# Patient Record
Sex: Female | Born: 1945
Health system: Southern US, Community
[De-identification: ages and names within clinical notes are randomized; demographics above are authoritative.]

## PROBLEM LIST (undated history)

## (undated) DIAGNOSIS — M1990 Unspecified osteoarthritis, unspecified site: Secondary | ICD-10-CM

## (undated) DIAGNOSIS — H269 Unspecified cataract: Secondary | ICD-10-CM

## (undated) DIAGNOSIS — I10 Essential (primary) hypertension: Secondary | ICD-10-CM

## (undated) DIAGNOSIS — E7800 Pure hypercholesterolemia, unspecified: Secondary | ICD-10-CM

## (undated) HISTORY — PX: OOPHORECTOMY: SHX86

---

## 2004-09-06 ENCOUNTER — Ambulatory Visit (HOSPITAL_COMMUNITY): Admission: RE | Admit: 2004-09-06 | Discharge: 2004-09-06 | Payer: Self-pay | Admitting: Family Medicine

## 2005-04-26 ENCOUNTER — Emergency Department (HOSPITAL_COMMUNITY): Admission: EM | Admit: 2005-04-26 | Discharge: 2005-04-26 | Payer: Self-pay | Admitting: Emergency Medicine

## 2005-06-06 ENCOUNTER — Emergency Department: Payer: Self-pay | Admitting: Emergency Medicine

## 2008-02-10 ENCOUNTER — Encounter (INDEPENDENT_AMBULATORY_CARE_PROVIDER_SITE_OTHER): Payer: Self-pay | Admitting: General Surgery

## 2008-02-10 ENCOUNTER — Ambulatory Visit (HOSPITAL_COMMUNITY): Admission: RE | Admit: 2008-02-10 | Discharge: 2008-02-10 | Payer: Self-pay | Admitting: General Surgery

## 2008-08-29 ENCOUNTER — Ambulatory Visit: Payer: Self-pay | Admitting: Family Medicine

## 2008-09-20 ENCOUNTER — Ambulatory Visit: Payer: Self-pay | Admitting: Family Medicine

## 2009-11-25 ENCOUNTER — Ambulatory Visit (HOSPITAL_COMMUNITY): Admission: RE | Admit: 2009-11-25 | Discharge: 2009-11-25 | Payer: Self-pay | Admitting: Family Medicine

## 2009-11-25 ENCOUNTER — Encounter: Payer: Self-pay | Admitting: Orthopedic Surgery

## 2010-05-20 ENCOUNTER — Telehealth (INDEPENDENT_AMBULATORY_CARE_PROVIDER_SITE_OTHER): Payer: Self-pay

## 2010-05-20 ENCOUNTER — Encounter: Payer: Self-pay | Admitting: Gastroenterology

## 2010-05-21 ENCOUNTER — Ambulatory Visit (HOSPITAL_COMMUNITY): Admission: RE | Admit: 2010-05-21 | Payer: Self-pay | Admitting: Gastroenterology

## 2010-07-22 NOTE — Letter (Signed)
Summary: TCS ORDER/TRIAGE  TCS ORDER/TRIAGE   Imported By: Rexene Alberts 05/20/2010 11:12:46  _____________________________________________________________________  External Attachment:    Type:   Image     Comment:   External Document

## 2010-07-22 NOTE — Progress Notes (Signed)
Summary: pt cx TCS for 05/21/2010/ Family emergency  Phone Note Call from Patient   Caller: Patient Summary of Call: pt called to cancel tcs for tomorrow. She has had a family emergency. Will call and reschedule later. LMOM for Kim. and cx in IDX. Initial call taken by: Cloria Spring LPN,  May 20, 2010 10:35 AM

## 2010-08-20 ENCOUNTER — Ambulatory Visit (INDEPENDENT_AMBULATORY_CARE_PROVIDER_SITE_OTHER): Payer: Medicare PPO | Admitting: Orthopedic Surgery

## 2010-08-20 ENCOUNTER — Encounter: Payer: Self-pay | Admitting: Orthopedic Surgery

## 2010-08-20 DIAGNOSIS — M5126 Other intervertebral disc displacement, lumbar region: Secondary | ICD-10-CM | POA: Insufficient documentation

## 2010-08-20 DIAGNOSIS — M25559 Pain in unspecified hip: Secondary | ICD-10-CM | POA: Insufficient documentation

## 2010-08-28 NOTE — Letter (Signed)
Summary: *Orthopedic Consult Note  [facility]  [address] [place] [address]  [place], [place] [address]   Phone: [phone]  Fax: [phone]    Re:    Samantha Oliver DOB:    26-May-1946   Dear: Dr. Phillips Odor   Thank you for requesting that we see the above patient for consultation.  A copy of the detailed office note will be sent under separate cover, for your review.  Evaluation today is consistent with:  1)  HIP PAIN, RIGHT (ICD-719.45) 2)  DEGENERATIVE DISC DISEASE, LUMBOSACRAL SPINE W/RADICULOPATHY (ICD-722.10) 3)  FAMILY HISTORY OF ARTHRITIS (ICD-V17.7) 4)  FAMILY HISTORY OF DIABETES (ICD-V18.0)   Our recommendation is for: physical therapy, Neurontin, Naprosyn. Follow up 6 week       Thank you for this opportunity to look after your patient.  Sincerely,   Terrance Mass. MD.

## 2010-08-28 NOTE — Assessment & Plan Note (Signed)
Summary: EVAL/TREAT RT HIP PAIN/NEEDS XRAY/REF GOLDING/HUMANA/CAF   Vital Signs:  Patient profile:   65 year old female Height:      70 inches Weight:      203 pounds Pulse rate:   70 / minute Resp:     16 per minute  Vitals Entered By: Fuller Canada MD (August 20, 2010 9:09 AM)  Visit Type:  new patient Referring Provider:  Dr. Phillips Odor Primary Provider:  Dr. Phillips Odor  CC:  right hip and leg pain.  History of Present Illness: I saw Samantha Oliver in the office today for an initial visit.  She is a 65 years old woman with the complaint of:  right hip and leg pain.  No injury.  Xrays right knee June 2011, other xrays today.  Meds: Zocor, Norvasc, Lexapro, Flexeril two times a day.  C/O PAIN "WHOLE RIGHT LEG"   C/O THROBBING , 8/10 PAIN, CONSTANT PAIN;   She did have some therapy on her knee, and hip, but no therapy on her back    Allergies (verified): 1)  ! Jonne Ply  Past History:  Past Medical History: cholesterol anxiety depression htn  Past Surgical History: ovaries removed  Family History: Family History of Diabetes Family History of Arthritis  Social History: Patient is widowed.  retired no smoking no alcohol coffee daily 1 yr of college  Review of Systems Constitutional:  Denies weight loss, weight gain, fever, chills, and fatigue. Cardiovascular:  Denies chest pain, palpitations, fainting, and murmurs. Respiratory:  Denies short of breath, wheezing, couch, tightness, pain on inspiration, and snoring . Gastrointestinal:  Denies heartburn, nausea, vomiting, diarrhea, constipation, and blood in your stools. Genitourinary:  Denies frequency, urgency, difficulty urinating, painful urination, flank pain, and bleeding in urine. Neurologic:  Denies numbness, tingling, unsteady gait, dizziness, tremors, and seizure. Musculoskeletal:  Complains of joint pain, swelling, and stiffness; denies instability, redness, heat, and muscle pain. Endocrine:  Denies  excessive thirst, exessive urination, and heat or cold intolerance. Psychiatric:  Complains of nervousness; denies depression, anxiety, and hallucinations. Skin:  Denies changes in the skin, poor healing, rash, itching, and redness. HEENT:  Complains of watering; denies blurred or double vision, eye pain, and redness. Immunology:  Denies seasonal allergies, sinus problems, and allergic to bee stings. Hemoatologic:  Complains of brusing; denies easy bleeding.  Physical Exam  Additional Exam:  GEN: well developed, well nourished, normal grooming and hygiene, no deformity body habitus, medium to lower  CDV: pulses are normal, no edema, no erythema. no tenderness  Lymph: normal lymph nodes   Skin: no rashes, skin lesions or open sores   NEURO: normal coordination, reflexes, sensation.   Psyche: awake, alert and oriented. Mood normal   Gait: normal heel toe gait  back exam inspection and flat back syndrome, decreased range of motion, no real tenderness. No instability.  RIGHT hip exam. No tenderness. Normal flexion, extension. Normal motor exam in her lower extremities. Both hips and both knees are stable.  She did have a straight leg raise on the RIGHT, negative on the LEFT    Impression & Recommendations:  Problem # 1:  HIP PAIN, RIGHT (ICD-719.45)  Her updated medication list for this problem includes:    Naprosyn 500 Mg Tabs (Naproxen) .Marland Kitchen... 1 by mouth two times a day  Orders: Pelvis x-ray, 1/2 views (40981) Physical Therapy Referral (PT)  Problem # 2:  DEGENERATIVE DISC DISEASE, LUMBOSACRAL SPINE W/RADICULOPATHY (ICD-722.10)  Orders: New Patient Level III (19147) Lumbosacral Spine ,2/3 views (82956) Physical Therapy  Referral (PT) Separate and Identifiable X-Ray report      AP pelvis, or hip pain, normal pelvis normal. Hip joints.  Impression normal hip.  3 views, lumbar spine, standard AP, lateral, and spot.  Flatback degenerative disc disease, facet  arthritis.  Impression degenerative disc disease  Medications Added to Medication List This Visit: 1)  Neurontin 100 Mg Caps (Gabapentin) .Marland Kitchen.. 1 by mouth three times a day 2)  Naprosyn 500 Mg Tabs (Naproxen) .Marland Kitchen.. 1 by mouth two times a day  Patient Instructions: 1)  START THERAPY on back  2)  Start neurontin and naprosyn x 6 weeks  3)  return in 6 weeks  Prescriptions: NAPROSYN 500 MG TABS (NAPROXEN) 1 by mouth two times a day  #60 x 1   Entered and Authorized by:   Fuller Canada MD   Signed by:   Fuller Canada MD on 08/20/2010   Method used:   Print then Give to Patient   RxID:   [id number] NEURONTIN 100 MG CAPS (GABAPENTIN) 1 by mouth three times a day  #84 x 1   Entered and Authorized by:   Fuller Canada MD   Signed by:   Fuller Canada MD on 08/20/2010   Method used:   Print then Give to Patient   RxID:   [id number]    Orders Added: 1)  New Patient Level III [52841] 2)  Lumbosacral Spine ,2/3 views [72100] 3)  Pelvis x-ray, 1/2 views [72170] 4)  Physical Therapy Referral [PT]

## 2010-09-02 ENCOUNTER — Ambulatory Visit (HOSPITAL_COMMUNITY): Payer: Medicare PPO | Admitting: Physical Therapy

## 2010-09-18 NOTE — Letter (Signed)
Summary: History form  History form   Imported By: Cammie Sickle 09/07/2010 13:53:55  _____________________________________________________________________  External Attachment:    Type:   Image     Comment:   External Document

## 2010-10-02 ENCOUNTER — Ambulatory Visit: Payer: Medicare PPO | Admitting: Orthopedic Surgery

## 2010-10-23 ENCOUNTER — Encounter: Payer: Self-pay | Admitting: Orthopedic Surgery

## 2010-10-23 ENCOUNTER — Ambulatory Visit (INDEPENDENT_AMBULATORY_CARE_PROVIDER_SITE_OTHER): Payer: Medicare PPO | Admitting: Orthopedic Surgery

## 2010-10-23 DIAGNOSIS — M5136 Other intervertebral disc degeneration, lumbar region: Secondary | ICD-10-CM

## 2010-10-23 DIAGNOSIS — M5137 Other intervertebral disc degeneration, lumbosacral region: Secondary | ICD-10-CM

## 2010-10-23 NOTE — Progress Notes (Signed)
65 year old female with RIGHT leg pain, thought to be due to sciatica and degenerative disc disease was sent for therapy, but couldn't go she did take the Naprosyn, and Neurontin and started exercising on her own and her pain was relieved.  She has  normal. Strength in her legs and extremities.  Followup as needed

## 2010-11-04 NOTE — Op Note (Signed)
NAME:  Samantha Oliver, ARRAMBIDE NO.:  [id number]   MEDICAL RECORD NO.:  [id number]          PATIENT TYPE:  AMB   LOCATION:  DAY                           FACILITY:  [facility]   PHYSICIAN:  Tilford Pillar, MD      DATE OF BIRTH:  September 22, 1945   DATE OF PROCEDURE:  02/10/2008  DATE OF DISCHARGE:                               OPERATIVE REPORT   PREOPERATIVE DIAGNOSIS:  Right preauricular skin nodules.   POSTOPERATIVE DIAGNOSIS:  Right preauricular skin nodules.   PROCEDURE:  Excision of preauricular skin nodules via 1-cm incision.   SURGEON:  Tilford Pillar, MD.   ANESTHESIA:  MAC sedation with local anesthetic, 1% Sensorcaine with  epinephrine.   SPECIMEN:  Skin nodule.   ESTIMATED BLOOD LOSS:  Scant.   INDICATIONS:  The patient is a 65 year old female who presented to my  office with a history of a nodule just in front of her right ear.  This  has been present for sometime, but has not had any significant change in  size over the last several months.  She has had no fever, no chills, no  weight loss.  The risks, benefits, and alternatives of excision were  discussed at length with the patient including the risk of bleeding,  infection, and recurrence.  Possible etiologies were discussed with the  patient.  Although, the suspicion is if this a benign nodule.  The  patient's questions and concerns were addressed and the patient was  consented for the planned procedure.   OPERATION:  The patient was taken to the operating room.  She was placed  in the supine position on the operating room table at which time she was  given the sedation.  Her right ear and cheek were prepped with DuraPrep  solution.  Sterile eye drape and drapes were placed over the area.  Local anesthetic was instilled and an elliptical incision was created  over the nodule.  I did a dissection down through the subcuticular  tissue was carried out using electrocautery.  The cyst was dissected  free from the  surrounding tissue.  This was placed on a back table and  sent as a permanent specimen to pathology.  Hemostasis was obtained with  the electrocautery.  The wound was irrigated and the skin was  reapproximated with a 4-0 Monocryl and a running subcuticular suture.  The skin was washed and dried with moist and dry lap sponge and then  Benzoin was applied around the incision.  Half-inch Steri-Strips were  placed and the patient was allowed to come out of sedation.  She was  transferred back to a regular hospital bed and was transferred to  postanesthetic care unit in stable condition.  At the conclusion of the  procedure, all instrument, sponge, and needle counts were correct.  The  patient tolerated the procedure well.      Tilford Pillar, MD  Electronically Signed     BZ/MEDQ  D:  02/10/2008  T:  02/10/2008  Job:  [id number]   cc:   Dr. Phillips Odor

## 2010-11-07 NOTE — H&P (Signed)
NAME:  Samantha Oliver, BIBER NO.:  [id number]   MEDICAL RECORD NO.:  [id number]          PATIENT TYPE:  AMB   LOCATION:  DAY                           FACILITY:  [facility]   PHYSICIAN:  Tilford Pillar, MD      DATE OF BIRTH:  11/11/1945   DATE OF ADMISSION:  DATE OF DISCHARGE:  LH                              HISTORY & PHYSICAL   CHIEF COMPLAINT:  Cyst in front of the right ear and a right eye cyst.   HISTORY OF PRESENT ILLNESS:  The patient is a 65 year old female who was  referred to my office by Dr. Phillips Odor after a several-month history of a  right preauricular nodule.  This has not significantly changed in size,  however, has not resolved in the interim as well.  She has had no  drainage.  No overlying skin changes.  No fever or chills.  No weight  loss.  She has had no similar lesions in the past.  She has also noted a  small nodular lesion on the right eyelid.  This has slowly increased in  size but has not inhibited vision and has not caused any problems with  eye pain.   PAST MEDICAL HISTORY:  1. Depression.  2. Hypertension.   PAST SURGICAL HISTORY:  None.   MEDICATIONS:  Lexapro, amlodipine.   ALLERGIES:  She states that ASPIRIN makes her sick.   SOCIAL HISTORY:  No tobacco, no alcohol use.  Occupation, she is an  Midwife.   REVIEW OF SYSTEMS:  CONSTITUTIONAL:  Unremarkable.  EYES:  Unremarkable.  EARS, NOSE, and THROAT:  Unremarkable.  RESPIRATORY:  Unremarkable.  CARDIOVASCULAR:  Unremarkable.  GASTROINTESTINAL:  Occasional abdominal  pain and indigestion.  GENITOURINARY:  Unremarkable.  MUSCULOSKELETAL:  Joint arthralgias.  SKIN:  Unremarkable.  ENDOCRINE:  Unremarkable.  NEURO:  Unremarkable.   PHYSICAL EXAMINATION:  GENERAL:  The patient is a moderately obese  female in no acute distress.  She is alert and oriented x3.  SCALP:  No deformities.  She has no hair loss.  She does have a  preauricular nodule.  This is mobile, nontender, approximately 1  cm in  the maximum dimension and is round and smooth in its characteristics.  She also has a right eyelid skin lesion.  This is on the medial aspects  of the eyelid.  EYES:  Pupils equal, round, reactive to light.  Extraocular movements  are intact.  No scleral icterus or conjunctival pallor.  No diminished  hearing.  Oral mucosa pink and moist.  Normal occlusion.  NECK:  Trachea is midline.  No cervical lymphadenopathy is appreciated.  PULMONARY:  Unlabored respirations.  She is clear to auscultation  bilaterally.  CARDIOVASCULAR:  Regular rate and rhythm.  Pulses are 2+ radial.  ABDOMINAL:  She has positive bowel sounds.  Abdomen is soft.  SKIN:  Warm, dry.   ASSESSMENT AND PLAN:  1. Preauricular skin nodule.  2. Right eye skin nodule.  In regards to the preauricular skin nodule, I do suspect this a probably  an enlarged lymph node.  Based on this, I did recommend  the options of  continued observation versus excision with pathologic evaluation.  Risks, benefits, and alternative of excision were discussed at length  with the patient including but not limited to risk of bleeding,  infection, recurrence as well as possibility of local area paresthesia.  In addition, I also discussed with the patient the finding of the left  eyelid soft tissue lesion.  This appears to be a small cyst on the  eyelid.  At this time as it is not causing her any symptomatology and is  quite small in its presentation, I would continue to monitor this.  However if the patient does wish to proceed with excision due to its  placement on the eyelid, I would recommend referral to a plastic surgeon  or ears, nose and throat facial plastic specialist for excision so that  the mechanical aspects of the eye closure is not disturbed.  This was  discussed with the patient as well as the possibility of having both  these lesions excised at the same time by the same referral physician.  However at this time the patient  is in agreement that she will just  continue to watch the eyelid lesion and will proceed with excision of  the right preauricular nodule.      Tilford Pillar, MD  Electronically Signed     BZ/MEDQ  D:  01/17/2008  T:  01/18/2008  Job:  [id number]   cc:   [facility]   Edsel Petrin, D.O.  Fax: [phone]

## 2011-08-19 ENCOUNTER — Other Ambulatory Visit (HOSPITAL_COMMUNITY): Payer: Self-pay | Admitting: Family Medicine

## 2011-08-19 ENCOUNTER — Ambulatory Visit (HOSPITAL_COMMUNITY)
Admission: RE | Admit: 2011-08-19 | Discharge: 2011-08-19 | Disposition: A | Discharge status: Home / Self Care | Payer: Medicare PPO | Source: Ambulatory Visit | Attending: Family Medicine | Admitting: Family Medicine

## 2011-08-19 DIAGNOSIS — E785 Hyperlipidemia, unspecified: Secondary | ICD-10-CM

## 2011-08-19 DIAGNOSIS — I10 Essential (primary) hypertension: Secondary | ICD-10-CM

## 2011-08-19 DIAGNOSIS — R079 Chest pain, unspecified: Secondary | ICD-10-CM

## 2011-08-27 NOTE — H&P (Signed)
  NTS SOAP Note  Vital Signs:  Vitals as of: 08/27/2011: Systolic 156: Diastolic 82: Heart Rate 68: Temp 97.88F: Height 81ft 10in: Weight 216Lbs 5 Ounces: OFC 0in: Respiratory Rate 0: O2 Saturation 0: Pain Level 0: BMI 31  BMI : 31.04 kg/m2  Subjective: This 66 Years 53 Months old Female presents forscreening TCS.  Never has had a colonoscopy.  No family h/o colon carcinoma.  Denies gi complaints.  Review of Symptoms:  Constitutional:unremarkable Head:unremarkable Eyes:unremarkable Nose/Mouth/Throat:unremarkable Cardiovascular:unremarkable Respiratory:unremarkable Gastrointestinal:unremarkable Genitourinary:unremarkable joint, back pain Skin:unremarkable Hematolgic/Lymphatic:unremarkable Allergic/Immunologic:unremarkable   Past Medical History:Reviewed   Past Medical History  Surgical History: oophorectomy Medical Problems:  High Blood pressure, High cholesterol Allergies: nkda Medications: lipitor, amlodipine   Social History:Reviewed   Social History  Preferred Language: English ([place]) Race:  Black or African American Ethnicity: Not Hispanic / Latino Age: 66 Years 10 Months Marital Status:  S Alcohol:  No Recreational drug(s):  No   Smoking Status: Never smoker reviewed on 08/27/2011  Family History:Reviewed   Family History  Is there a family history of:No family h/o colon carcinoma    Objective Information: General:Well appearing, well nourished in no distress. Skin:no rash or prominent lesions Head:Atraumatic; no masses; no abnormalities Neck:Supple without lymphadenopathy.  Heart:RRR, no murmur or gallop.  Normal S1, S2.  No S3, S4.  Lungs:CTA bilaterally, no wheezes, rhonchi, rales.  Breathing unlabored. Abdomen:Soft, NT/ND, no HSM, no masses. deferred to procedure  Assessment:Need for screening TCS  Diagnosis &amp; Procedure: DiagnosisCode: V76.51,  ProcedureCode: [address],    Plan: Scheduled for TCS on 09/08/11.   Patient Education:Alternative treatments to surgery were discussed with patient (and family).Risks and benefits  of procedure were fully explained to the patient (and family) who gave informed consent. Patient/family questions were addressed.  Follow-up:Pending Surgery

## 2011-09-02 ENCOUNTER — Encounter (HOSPITAL_COMMUNITY): Payer: Self-pay | Admitting: Pharmacy Technician

## 2011-09-07 MED ORDER — SODIUM CHLORIDE 0.45 % IV SOLN
Freq: Once | INTRAVENOUS | Status: AC
  Administered 2011-09-08: 20 mL/h via INTRAVENOUS

## 2011-09-08 ENCOUNTER — Encounter (HOSPITAL_COMMUNITY): Payer: Self-pay | Admitting: *Deleted

## 2011-09-08 ENCOUNTER — Encounter (HOSPITAL_COMMUNITY)
Admission: RE | Disposition: A | Discharge status: Home / Self Care | Payer: Self-pay | Source: Ambulatory Visit | Attending: General Surgery

## 2011-09-08 ENCOUNTER — Ambulatory Visit (HOSPITAL_COMMUNITY)
Admission: RE | Admit: 2011-09-08 | Discharge: 2011-09-08 | Disposition: A | Discharge status: Home / Self Care | Payer: Medicare PPO | Source: Ambulatory Visit | Attending: General Surgery | Admitting: General Surgery

## 2011-09-08 DIAGNOSIS — Z79899 Other long term (current) drug therapy: Secondary | ICD-10-CM | POA: Insufficient documentation

## 2011-09-08 DIAGNOSIS — E7800 Pure hypercholesterolemia, unspecified: Secondary | ICD-10-CM | POA: Insufficient documentation

## 2011-09-08 DIAGNOSIS — I10 Essential (primary) hypertension: Secondary | ICD-10-CM | POA: Insufficient documentation

## 2011-09-08 DIAGNOSIS — Z1211 Encounter for screening for malignant neoplasm of colon: Secondary | ICD-10-CM | POA: Insufficient documentation

## 2011-09-08 HISTORY — PX: COLONOSCOPY: SHX5424

## 2011-09-08 HISTORY — DX: Unspecified osteoarthritis, unspecified site: M19.90

## 2011-09-08 HISTORY — DX: Essential (primary) hypertension: I10

## 2011-09-08 HISTORY — DX: Pure hypercholesterolemia, unspecified: E78.00

## 2011-09-08 HISTORY — DX: Unspecified cataract: H26.9

## 2011-09-08 SURGERY — COLONOSCOPY
Anesthesia: Moderate Sedation

## 2011-09-08 MED ORDER — ATROPINE SULFATE 1 MG/ML IJ SOLN
INTRAMUSCULAR | Status: AC
  Filled 2011-09-08: qty 1

## 2011-09-08 MED ORDER — STERILE WATER FOR IRRIGATION IR SOLN
Status: DC | PRN
  Administered 2011-09-08: 09:00:00

## 2011-09-08 MED ORDER — MIDAZOLAM HCL 5 MG/5ML IJ SOLN
INTRAMUSCULAR | Status: DC | PRN
  Administered 2011-09-08: 2 mg via INTRAVENOUS

## 2011-09-08 MED ORDER — MEPERIDINE HCL 25 MG/ML IJ SOLN
INTRAMUSCULAR | Status: DC | PRN
  Administered 2011-09-08: 50 mg via INTRAVENOUS

## 2011-09-08 MED ORDER — ATROPINE SULFATE 1 MG/ML IJ SOLN
INTRAMUSCULAR | Status: DC | PRN
  Administered 2011-09-08: .05 mg via INTRAVENOUS

## 2011-09-08 MED ORDER — MEPERIDINE HCL 50 MG/ML IJ SOLN
INTRAMUSCULAR | Status: AC
  Filled 2011-09-08: qty 1

## 2011-09-08 MED ORDER — MIDAZOLAM HCL 5 MG/5ML IJ SOLN
INTRAMUSCULAR | Status: AC
  Filled 2011-09-08: qty 5

## 2011-09-08 NOTE — Op Note (Signed)
[facility] [address] [place], [place]  [address]  COLONOSCOPY PROCEDURE REPORT  PATIENT:  Samantha, Oliver  MR#:  [id number] BIRTHDATE:  [birth year], 65 yrs. old  GENDER:  female ENDOSCOPIST:  Franky Macho, MD REF. BY:  Assunta Found, M.D. PROCEDURE DATE:  09/08/2011 PROCEDURE: ASA CLASS:  Class II INDICATIONS:  Screening MEDICATIONS:   Atropine 0.5 mg IV, Versed 3 mg IV, demerol 50 mg IV  DESCRIPTION OF PROCEDURE:   After the risks benefits and alternatives of the procedure were thoroughly explained, informed consent was obtained.  Digital rectal exam was performed and revealed no abnormalities.   The EC-[id number] ([id number]) endoscope was introduced through the anus and advanced to the cecum, which was identified by both the appendix and ileocecal valve, without limitations.  The quality of the prep was adequate..  The instrument was then slowly withdrawn as the colon was fully examined.  FINDINGS:  Rare diverticula were found in the descending colon. Normal ascending, transverse, and sigmoid colon found.  No polyps seen.  Atropine given for bradycardia to 39, though patient did not become hypotensive.  Starting pulse 60.  Retroflexion was performed.   The scope was then withdrawn  from the cecum and the procedure completed. COMPLICATIONS:  None ENDOSCOPIC IMPRESSION: 1) Diverticula in the descending colon 2) Normal colonoscopy otherwise RECOMMENDATIONS:  REPEAT EXAM:  In 10 year(s) for Colonoscopy.  ______________________________ Franky Macho, MD  CC:  Assunta Found, MD  n. Rosalie DoctorFranky Macho at 09/08/2011 09:04 AM  Gaylyn Rong, [phone]

## 2011-09-08 NOTE — Discharge Instructions (Signed)
Colonoscopy Care After Read the instructions outlined below and refer to this sheet in the next few weeks. These discharge instructions provide you with general information on caring for yourself after you leave the hospital. Your doctor may also give you specific instructions. While your treatment has been planned according to the most current medical practices available, unavoidable complications occasionally occur. If you have any problems or questions after discharge, call your doctor. HOME CARE INSTRUCTIONS ACTIVITY:  You may resume your regular activity, but move at a slower pace for the next 24 hours.   Take frequent rest periods for the next 24 hours.   Walking will help get rid of the air and reduce the bloated feeling in your belly (abdomen).   No driving for 24 hours (because of the medicine (anesthesia) used during the test).   You may shower.   Do not sign any important legal documents or operate any machinery for 24 hours (because of the anesthesia used during the test).  NUTRITION:  Drink plenty of fluids.   You may resume your normal diet as instructed by your doctor.   Begin with a light meal and progress to your normal diet. Heavy or fried foods are harder to digest and may make you feel sick to your stomach (nauseated).   Avoid alcoholic beverages for 24 hours or as instructed.  MEDICATIONS:  You may resume your normal medications unless your doctor tells you otherwise.  WHAT TO EXPECT TODAY:  Some feelings of bloating in the abdomen.   Passage of more gas than usual.   Spotting of blood in your stool or on the toilet paper.  IF YOU HAD POLYPS REMOVED DURING THE COLONOSCOPY:  No aspirin products for 7 days or as instructed.   No alcohol for 7 days or as instructed.   Eat a soft diet for the next 24 hours.  FINDING OUT THE RESULTS OF YOUR TEST Not all test results are available during your visit. If your test results are not back during the visit, make an  appointment with your caregiver to find out the results. Do not assume everything is normal if you have not heard from your caregiver or the medical facility. It is important for you to follow up on all of your test results.  SEEK IMMEDIATE MEDICAL CARE IF:  You have more than a spotting of blood in your stool.   Your belly is swollen (abdominal distention).   You are nauseated or vomiting.   You have a fever.   You have abdominal pain or discomfort that is severe or gets worse throughout the day.  Document Released: 01/21/2004 Document Revised: 05/28/2011 Document Reviewed: 01/19/2008 ExitCare Patient Information 2012 [employer].   Diverticulosis Diverticulosis is a common condition that develops when small pouches (diverticula) form in the wall of the colon. The risk of diverticulosis increases with age. It happens more often in people who eat a low-fiber diet. Most individuals with diverticulosis have no symptoms. Those individuals with symptoms usually experience abdominal pain, constipation, or loose stools (diarrhea). HOME CARE INSTRUCTIONS   Increase the amount of fiber in your diet as directed by your caregiver or dietician. This may reduce symptoms of diverticulosis.   Your caregiver may recommend taking a dietary fiber supplement.   Drink at least 6 to 8 glasses of water each day to prevent constipation.   Try not to strain when you have a bowel movement.   Your caregiver may recommend avoiding nuts and seeds to prevent   complications, although this is still an uncertain benefit.   Only take over-the-counter or prescription medicines for pain, discomfort, or fever as directed by your caregiver.  FOODS WITH HIGH FIBER CONTENT INCLUDE:  Fruits. Apple, peach, pear, tangerine, raisins, prunes.   Vegetables. Brussels sprouts, asparagus, broccoli, cabbage, carrot, cauliflower, romaine lettuce, spinach, summer squash, tomato, winter squash, zucchini.   Starchy Vegetables.  Baked beans, kidney beans, lima beans, split peas, lentils, potatoes (with skin).   Grains. Whole wheat bread, brown rice, bran flake cereal, plain oatmeal, white rice, shredded wheat, bran muffins.  SEEK IMMEDIATE MEDICAL CARE IF:   You develop increasing pain or severe bloating.   You have an oral temperature above 102 F (38.9 C), not controlled by medicine.   You develop vomiting or bowel movements that are bloody or black.  Document Released: 03/05/2004 Document Revised: 05/28/2011 Document Reviewed: 11/06/2009 Exit[employer] Patient Information 2012 [employer]. 

## 2011-09-08 NOTE — Interval H&P Note (Signed)
History and Physical Interval Note:  09/08/2011 8:33 AM  Samantha Oliver  has presented today for surgery, with the diagnosis of Special screening for malignant neoplasms, colon   The various methods of treatment have been discussed with the patient and family. After consideration of risks, benefits and other options for treatment, the patient has consented to  Procedure(s) (LRB): COLONOSCOPY (N/A) as a surgical intervention .  The patients' history has been reviewed, patient examined, no change in status, stable for surgery.  I have reviewed the patients' chart and labs.  Questions were answered to the patient's satisfaction.     Franky Macho A

## 2011-09-11 ENCOUNTER — Encounter (HOSPITAL_COMMUNITY): Payer: Self-pay | Admitting: General Surgery

## 2011-12-23 ENCOUNTER — Other Ambulatory Visit (HOSPITAL_COMMUNITY): Payer: Self-pay | Admitting: Family Medicine

## 2011-12-23 ENCOUNTER — Ambulatory Visit (HOSPITAL_COMMUNITY)
Admission: RE | Admit: 2011-12-23 | Discharge: 2011-12-23 | Disposition: A | Discharge status: Home / Self Care | Payer: Medicare PPO | Source: Ambulatory Visit | Attending: Family Medicine | Admitting: Family Medicine

## 2011-12-23 DIAGNOSIS — Z139 Encounter for screening, unspecified: Secondary | ICD-10-CM

## 2011-12-23 DIAGNOSIS — M25579 Pain in unspecified ankle and joints of unspecified foot: Secondary | ICD-10-CM | POA: Insufficient documentation

## 2011-12-23 DIAGNOSIS — M7730 Calcaneal spur, unspecified foot: Secondary | ICD-10-CM | POA: Insufficient documentation

## 2011-12-29 ENCOUNTER — Ambulatory Visit (HOSPITAL_COMMUNITY)
Admission: RE | Admit: 2011-12-29 | Discharge: 2011-12-29 | Disposition: A | Discharge status: Home / Self Care | Payer: Medicare PPO | Source: Ambulatory Visit | Attending: Family Medicine | Admitting: Family Medicine

## 2011-12-29 DIAGNOSIS — Z139 Encounter for screening, unspecified: Secondary | ICD-10-CM

## 2011-12-29 DIAGNOSIS — M069 Rheumatoid arthritis, unspecified: Secondary | ICD-10-CM | POA: Insufficient documentation

## 2011-12-29 DIAGNOSIS — Z780 Asymptomatic menopausal state: Secondary | ICD-10-CM | POA: Insufficient documentation

## 2011-12-29 DIAGNOSIS — Z1231 Encounter for screening mammogram for malignant neoplasm of breast: Secondary | ICD-10-CM | POA: Insufficient documentation

## 2011-12-29 DIAGNOSIS — M899 Disorder of bone, unspecified: Secondary | ICD-10-CM | POA: Insufficient documentation

## 2011-12-29 DIAGNOSIS — Z79899 Other long term (current) drug therapy: Secondary | ICD-10-CM | POA: Insufficient documentation

## 2012-06-10 ENCOUNTER — Encounter (HOSPITAL_COMMUNITY): Payer: Self-pay | Admitting: *Deleted

## 2012-06-10 ENCOUNTER — Emergency Department (HOSPITAL_COMMUNITY): Payer: Medicare PPO

## 2012-06-10 ENCOUNTER — Emergency Department (HOSPITAL_COMMUNITY)
Admission: EM | Admit: 2012-06-10 | Discharge: 2012-06-10 | Disposition: A | Discharge status: Home / Self Care | Payer: Medicare PPO | Attending: Emergency Medicine | Admitting: Emergency Medicine

## 2012-06-10 DIAGNOSIS — Z79899 Other long term (current) drug therapy: Secondary | ICD-10-CM | POA: Insufficient documentation

## 2012-06-10 DIAGNOSIS — I10 Essential (primary) hypertension: Secondary | ICD-10-CM | POA: Insufficient documentation

## 2012-06-10 DIAGNOSIS — H25099 Other age-related incipient cataract, unspecified eye: Secondary | ICD-10-CM | POA: Insufficient documentation

## 2012-06-10 DIAGNOSIS — E7800 Pure hypercholesterolemia, unspecified: Secondary | ICD-10-CM | POA: Insufficient documentation

## 2012-06-10 DIAGNOSIS — Z8739 Personal history of other diseases of the musculoskeletal system and connective tissue: Secondary | ICD-10-CM | POA: Insufficient documentation

## 2012-06-10 DIAGNOSIS — R51 Headache: Secondary | ICD-10-CM

## 2012-06-10 DIAGNOSIS — R059 Cough, unspecified: Secondary | ICD-10-CM | POA: Insufficient documentation

## 2012-06-10 DIAGNOSIS — R110 Nausea: Secondary | ICD-10-CM | POA: Insufficient documentation

## 2012-06-10 DIAGNOSIS — R05 Cough: Secondary | ICD-10-CM | POA: Insufficient documentation

## 2012-06-10 LAB — BASIC METABOLIC PANEL
BUN: 13 mg/dL (ref 6–23)
CO2: 25 mEq/L (ref 19–32)
Glucose, Bld: 110 mg/dL — ABNORMAL HIGH (ref 70–99)
Potassium: 4.3 mEq/L (ref 3.5–5.1)
Sodium: 134 mEq/L — ABNORMAL LOW (ref 135–145)

## 2012-06-10 LAB — CBC WITH DIFFERENTIAL/PLATELET
Eosinophils Absolute: 0 10*3/uL (ref 0.0–0.7)
Eosinophils Relative: 0 % (ref 0–5)
HCT: 36.8 % (ref 36.0–46.0)
Hemoglobin: 12.5 g/dL (ref 12.0–15.0)
Lymphs Abs: 0.4 10*3/uL — ABNORMAL LOW (ref 0.7–4.0)
MCH: 28.7 pg (ref 26.0–34.0)
MCHC: 34 g/dL (ref 30.0–36.0)
MCV: 84.4 fL (ref 78.0–100.0)
Monocytes Absolute: 0.7 10*3/uL (ref 0.1–1.0)
Monocytes Relative: 12 % (ref 3–12)
RBC: 4.36 MIL/uL (ref 3.87–5.11)

## 2012-06-10 MED ORDER — SODIUM CHLORIDE 0.9 % IV BOLUS (SEPSIS)
1000.0000 mL | Freq: Once | INTRAVENOUS | Status: AC
  Administered 2012-06-10: 1000 mL via INTRAVENOUS

## 2012-06-10 MED ORDER — MORPHINE SULFATE 2 MG/ML IJ SOLN
2.0000 mg | Freq: Once | INTRAMUSCULAR | Status: AC
  Administered 2012-06-10: 2 mg via INTRAVENOUS
  Filled 2012-06-10: qty 1

## 2012-06-10 MED ORDER — DIPHENHYDRAMINE HCL 50 MG/ML IJ SOLN
25.0000 mg | Freq: Once | INTRAMUSCULAR | Status: AC
  Administered 2012-06-10: 25 mg via INTRAVENOUS
  Filled 2012-06-10: qty 1

## 2012-06-10 MED ORDER — ONDANSETRON HCL 4 MG/2ML IJ SOLN
4.0000 mg | Freq: Once | INTRAMUSCULAR | Status: AC
  Administered 2012-06-10: 4 mg via INTRAVENOUS
  Filled 2012-06-10: qty 2

## 2012-06-10 MED ORDER — METOCLOPRAMIDE HCL 5 MG/ML IJ SOLN
10.0000 mg | Freq: Once | INTRAMUSCULAR | Status: AC
  Administered 2012-06-10: 10 mg via INTRAVENOUS
  Filled 2012-06-10: qty 2

## 2012-06-10 NOTE — ED Notes (Signed)
Reports onset of generalized body aches, nausea, and non-productive cough last night prior to going to bed. Denies v/d.

## 2012-06-10 NOTE — ED Provider Notes (Signed)
History  This chart was scribed for Samantha Gaskins, MD by Ardeen Jourdain, ED Scribe. This patient was seen in room APA12/APA12 and the patient's care was started at 0940.  CSN: [id number]  Arrival date & time 06/10/12  0919   First MD Initiated Contact with Patient 06/10/12 0940      Chief Complaint  Patient presents with  . Generalized Body Aches  . Nausea  . Cough     Patient is a 66 y.o. female presenting with cough. The history is provided by the patient. No language interpreter was used.  Cough This is a new problem. The current episode started 12 to 24 hours ago. The problem occurs constantly. The problem has been gradually worsening. The cough is non-productive. Associated symptoms include headaches. Pertinent negatives include no chest pain, no sore throat and no shortness of breath.   Samantha Oliver is a 66 y.o. female who presents to the Emergency Department complaining of cough with associated generalized body aches and HA. She states the symptoms started last night and have been gradually worsening. She states her HA is aggravated by her cough. She denies any h/o heart disease.   Past Medical History  Diagnosis Date  . Hypertension   . Hypercholesteremia   . Cataract immature   . Arthritis     Past Surgical History  Procedure Date  . Oophorectomy   . Colonoscopy 09/08/2011    Procedure: COLONOSCOPY;  Surgeon: Dalia Heading, MD;  Location: [facility];  Service: Gastroenterology;  Laterality: N/A;    No family history on file.  History  Substance Use Topics  . Smoking status: Never Smoker   . Smokeless tobacco: Not on file  . Alcohol Use: No   No OB history available.   Review of Systems  HENT: Negative for sore throat.   Respiratory: Positive for cough. Negative for shortness of breath.   Cardiovascular: Negative for chest pain.  Gastrointestinal: Positive for nausea. Negative for vomiting and diarrhea.  Neurological: Positive for headaches.   All other systems reviewed and are negative.    Allergies  Aspirin  Home Medications   Current Outpatient Rx  Name  Route  Sig  Dispense  Refill  . AMLODIPINE BESYLATE 5 MG PO TABS   Oral   Take 5 mg by mouth daily.         . ATORVASTATIN CALCIUM 20 MG PO TABS   Oral   Take 20 mg by mouth every evening.         . ADULT MULTIVITAMIN W/MINERALS CH   Oral   Take 1 tablet by mouth daily.           Triage Vitals: BP 148/58  Pulse 94  Temp 99.7 F (37.6 C) (Oral)  Resp 20  Ht 5\' 10"  (1.778 m)  Wt 199 lb (90.266 kg)  BMI 28.55 kg/m2  SpO2 96%  Physical Exam  CONSTITUTIONAL: Well developed/well nourished HEAD AND FACE: Normocephalic/atraumatic EYES: EOMI/PERRL ENMT: Mucous membranes moist, nasal congestion NECK: supple no meningeal signs SPINE:entire spine nontender CV: S1/S2 noted, no murmurs/rubs/gallops noted LUNGS: Lungs are clear to auscultation bilaterally, no apparent distress ABDOMEN: soft, nontender, no rebound or guarding GU:no cva tenderness NEURO: Pt is awake/alert, moves all extremitiesx4, no arm or leg drift, no facial droop EXTREMITIES: pulses normal, full ROM SKIN: warm, color normal PSYCH: no abnormalities of mood noted   ED Course  Procedures   DIAGNOSTIC STUDIES: Oxygen Saturation is 96% on room air, normal by my  interpretation.    COORDINATION OF CARE:  10:26 AM: Discussed treatment plan which includes a CXR and blood work with pt at bedside and pt agreed to plan.  10:28 AM: Medication Orders: ondansetron (ZOFRAN) injection 4 mg Once, morphine 2 MG/ML injection 2 mg Once, sodium chloride 0.9 % bolus 1,000 mL Once    Results for orders placed during the hospital encounter of 06/10/12  BASIC METABOLIC PANEL      Component Value Range   Sodium 134 (*) 135 - 145 mEq/L   Potassium 4.3  3.5 - 5.1 mEq/L   Chloride 102  96 - 112 mEq/L   CO2 25  19 - 32 mEq/L   Glucose, Bld 110 (*) 70 - 99 mg/dL   BUN 13  6 - 23 mg/dL   Creatinine,  Ser 1.61  0.50 - 1.10 mg/dL   Calcium 9.4  8.4 - 09.6 mg/dL   GFR calc non Af Amer 58 (*) >90 mL/min   GFR calc Af Amer 67 (*) >90 mL/min  CBC WITH DIFFERENTIAL      Component Value Range   WBC 5.7  4.0 - 10.5 K/uL   RBC 4.36  3.87 - 5.11 MIL/uL   Hemoglobin 12.5  12.0 - 15.0 g/dL   HCT 04.5  40.9 - 81.1 %   MCV 84.4  78.0 - 100.0 fL   MCH 28.7  26.0 - 34.0 pg   MCHC 34.0  30.0 - 36.0 g/dL   RDW 91.4  78.2 - 95.6 %   Platelets 230  150 - 400 K/uL   Neutrophils Relative 81 (*) 43 - 77 %   Neutro Abs 4.6  1.7 - 7.7 K/uL   Lymphocytes Relative 7 (*) 12 - 46 %   Lymphs Abs 0.4 (*) 0.7 - 4.0 K/uL   Monocytes Relative 12  3 - 12 %   Monocytes Absolute 0.7  0.1 - 1.0 K/uL   Eosinophils Relative 0  0 - 5 %   Eosinophils Absolute 0.0  0.0 - 0.7 K/uL   Basophils Relative 0  0 - 1 %   Basophils Absolute 0.0  0.0 - 0.1 K/uL   Ct Head Wo Contrast  06/10/2012  *RADIOLOGY REPORT*  Clinical Data:  Headache  CT HEAD WITHOUT CONTRAST  Technique:  Contiguous axial images were obtained from the base of the skull through the vertex without contrast  Comparison:  None.  Findings:  The brain has a normal appearance without evidence for hemorrhage, acute infarction, hydrocephalus, or mass lesion.  There is no extra axial fluid collection.  The skull and paranasal sinuses are normal.  IMPRESSION: Normal CT of the head without contrast.   Original Report Authenticated By: Janeece Riggers, M.D.    Dg Chest Port 1 View  06/10/2012  *RADIOLOGY REPORT*  Clinical Data: Cough, mild chest pain, hypertension, former smoker  PORTABLE CHEST - 1 VIEW  Comparison: Portable exam 1033 hours compared to 08/19/2011  Findings: Upper normal heart size. Tortuous aorta with atherosclerotic calcification at arch. Mediastinal contours and pulmonary vascularity normal. Minimal right basilar atelectasis. Minimal chronic peribronchial thickening. No acute infiltrate, pleural effusion or pneumothorax. Bones appear demineralized.   IMPRESSION: Minimal right basilar atelectasis.   Original Report Authenticated By: Ulyses Southward, M.D.      Pt monitored for several hours in the ED.  She reports cough/nausea/improving but still with HA.  Given her age, I did performed CT head that was negative.  Pt without neuro deficits.  She is ambulatory.  She reports HA did not start until after coughing.  Doubt SAH.  Doubt meningitis.  Stable for d/c   MDM  Nursing notes including past medical history and social history reviewed and considered in documentation Labs/vital reviewed and considered xrays reviewed and considered       I personally performed the services described in this documentation, which was scribed in my presence. The recorded information has been reviewed and is accurate.      Samantha Gaskins, MD 06/10/12 [phone]

## 2012-06-10 NOTE — ED Notes (Signed)
Pt ambulated to restroom with steady gait.

## 2012-11-14 ENCOUNTER — Encounter (HOSPITAL_COMMUNITY): Payer: Self-pay | Admitting: *Deleted

## 2012-11-14 ENCOUNTER — Emergency Department (HOSPITAL_COMMUNITY)
Admission: EM | Admit: 2012-11-14 | Discharge: 2012-11-14 | Disposition: A | Discharge status: Home / Self Care | Payer: Medicare PPO | Attending: Emergency Medicine | Admitting: Emergency Medicine

## 2012-11-14 ENCOUNTER — Emergency Department (HOSPITAL_COMMUNITY): Payer: Medicare PPO

## 2012-11-14 DIAGNOSIS — I10 Essential (primary) hypertension: Secondary | ICD-10-CM | POA: Insufficient documentation

## 2012-11-14 DIAGNOSIS — R0602 Shortness of breath: Secondary | ICD-10-CM | POA: Insufficient documentation

## 2012-11-14 DIAGNOSIS — J309 Allergic rhinitis, unspecified: Secondary | ICD-10-CM | POA: Insufficient documentation

## 2012-11-14 DIAGNOSIS — E7800 Pure hypercholesterolemia, unspecified: Secondary | ICD-10-CM | POA: Insufficient documentation

## 2012-11-14 DIAGNOSIS — R0789 Other chest pain: Secondary | ICD-10-CM | POA: Insufficient documentation

## 2012-11-14 DIAGNOSIS — Z9109 Other allergy status, other than to drugs and biological substances: Secondary | ICD-10-CM

## 2012-11-14 DIAGNOSIS — Z79899 Other long term (current) drug therapy: Secondary | ICD-10-CM | POA: Insufficient documentation

## 2012-11-14 DIAGNOSIS — J029 Acute pharyngitis, unspecified: Secondary | ICD-10-CM | POA: Insufficient documentation

## 2012-11-14 DIAGNOSIS — Z8669 Personal history of other diseases of the nervous system and sense organs: Secondary | ICD-10-CM | POA: Insufficient documentation

## 2012-11-14 DIAGNOSIS — J3489 Other specified disorders of nose and nasal sinuses: Secondary | ICD-10-CM | POA: Insufficient documentation

## 2012-11-14 DIAGNOSIS — M129 Arthropathy, unspecified: Secondary | ICD-10-CM | POA: Insufficient documentation

## 2012-11-14 DIAGNOSIS — R6883 Chills (without fever): Secondary | ICD-10-CM | POA: Insufficient documentation

## 2012-11-14 MED ORDER — DIPHENHYDRAMINE HCL 25 MG PO CAPS
25.0000 mg | ORAL_CAPSULE | Freq: Once | ORAL | Status: AC
  Administered 2012-11-14: 25 mg via ORAL
  Filled 2012-11-14: qty 1

## 2012-11-14 MED ORDER — BENZONATATE 100 MG PO CAPS: 100.0000 mg | ORAL_CAPSULE | Freq: Three times a day (TID) | ORAL | Status: DC

## 2012-11-14 MED ORDER — DEXAMETHASONE SODIUM PHOSPHATE 4 MG/ML IJ SOLN
8.0000 mg | Freq: Once | INTRAMUSCULAR | Status: AC
  Administered 2012-11-14: 8 mg via INTRAMUSCULAR
  Filled 2012-11-14: qty 2

## 2012-11-14 MED ORDER — PREDNISONE 20 MG PO TABS: 40.0000 mg | ORAL_TABLET | Freq: Every day | ORAL | Status: DC

## 2012-11-14 MED ORDER — PREDNISONE 10 MG PO TABS: 20.0000 mg | ORAL_TABLET | Freq: Every day | ORAL | Status: DC

## 2012-11-14 NOTE — ED Notes (Signed)
Patient w/seasonal allergies developed cough and slight SOB Friday night.  Has progressed since then.  States "my chest feels kind of tight".  No inhaler or nebulizers used at home.

## 2012-11-14 NOTE — ED Notes (Signed)
Discharge instructions reviewed with pt, questions answered. Pt verbalized understanding.  

## 2012-11-14 NOTE — ED Provider Notes (Signed)
History  This chart was scribed for Samantha Racer, MD by Bennett Scrape, ED Scribe. This patient was seen in room APA02/APA02 and the patient's care was started at 3:06 PM.  CSN: [id number]  Arrival date & time 11/14/12  1218   First MD Initiated Contact with Patient 11/14/12 1506      Chief Complaint  Patient presents with  . Cough  . Shortness of Breath    Patient is a 67 y.o. female presenting with cough. The history is provided by the patient. No language interpreter was used.  Cough Cough characteristics:  Non-productive Severity:  Mild Onset quality:  Gradual Duration:  3 days Timing:  Constant Progression:  Worsening Chronicity:  New Smoker: no   Context: exposure to allergens   Relieved by:  Nothing Worsened by:  Nothing tried Associated symptoms: chills and sore throat   Associated symptoms: no chest pain, no fever, no rhinorrhea and no shortness of breath     HPI Comments: Samantha Oliver is a 67 y.o. female who presents to the Emergency Department complaining of 3 days of gradual onset, gradually worsening, constant non-productive cough with associated chest tightness that radiates into the throat, sore throat, nasal congestion and chills that she attributes to allergies after mowing the lawn. She reports that she has been using Claritin with no improvement. She denies inhaler or nebulizer use at home. She denies CP, SOB, rhinorrhea and fevers as associated symptoms. She has a h/o HTN and HLD. Pt denies smoking and alcohol use.   Past Medical History  Diagnosis Date  . Hypertension   . Hypercholesteremia   . Cataract immature   . Arthritis     Past Surgical History  Procedure Laterality Date  . Oophorectomy    . Colonoscopy  09/08/2011    Procedure: COLONOSCOPY;  Surgeon: Dalia Heading, MD;  Location: [facility];  Service: Gastroenterology;  Laterality: N/A;    History reviewed. No pertinent family history.  History  Substance Use Topics  .  Smoking status: Never Smoker   . Smokeless tobacco: Not on file  . Alcohol Use: No    No OB history provided.  Review of Systems  Constitutional: Positive for chills. Negative for fever.  HENT: Positive for congestion and sore throat. Negative for rhinorrhea.   Respiratory: Positive for cough and chest tightness. Negative for shortness of breath.   Cardiovascular: Negative for chest pain.  All other systems reviewed and are negative.    Allergies  Aspirin  Home Medications   Current Outpatient Rx  Name  Route  Sig  Dispense  Refill  . amLODipine (NORVASC) 5 MG tablet   Oral   Take 5 mg by mouth daily.         Marland Kitchen atorvastatin (LIPITOR) 20 MG tablet   Oral   Take 20 mg by mouth every evening.         . loratadine (CLARITIN) 10 MG tablet   Oral   Take 10 mg by mouth daily.         . Multiple Vitamin (MULTIVITAMIN WITH MINERALS) TABS   Oral   Take 1 tablet by mouth daily.         . benzonatate (TESSALON) 100 MG capsule   Oral   Take 1 capsule (100 mg total) by mouth every 8 (eight) hours.   21 capsule   0   . predniSONE (DELTASONE) 20 MG tablet   Oral   Take 2 tablets (40 mg total) by mouth daily.  10 tablet   0     Triage Vitals: BP 129/61  Pulse 79  Temp(Src) 98 F (36.7 C) (Oral)  Resp 20  Ht 5\' 10"  (1.778 m)  Wt 198 lb (89.812 kg)  BMI 28.41 kg/m2  SpO2 99%  Physical Exam  Nursing note and vitals reviewed. Constitutional: She is oriented to person, place, and time. She appears well-developed and well-nourished. No distress.  HENT:  Head: Normocephalic and atraumatic.  Mouth/Throat: Oropharynx is clear and moist.  Eyes: Conjunctivae and EOM are normal. Pupils are equal, round, and reactive to light.  Neck: Neck supple. No tracheal deviation present.  Cardiovascular: Normal rate, regular rhythm and normal heart sounds.   Pulmonary/Chest: Effort normal and breath sounds normal. No stridor. No respiratory distress. She has no wheezes.   Abdominal: Soft. There is no tenderness.  Musculoskeletal: Normal range of motion. She exhibits no edema.  Neurological: She is alert and oriented to person, place, and time.  Skin: Skin is warm and dry.  Psychiatric: She has a normal mood and affect. Her behavior is normal.    ED Course  Procedures (including critical care time)  Medications  dexamethasone (DECADRON) injection 8 mg (8 mg Intramuscular Given 11/14/12 1543)  diphenhydrAMINE (BENADRYL) capsule 25 mg (25 mg Oral Given 11/14/12 1542)    DIAGNOSTIC STUDIES: Oxygen Saturation is 99% on room air, normal by my interpretation.    COORDINATION OF CARE: 3:26 PM-Informed pt of negative CXR and advised that I believe this is related to allergies. Discussed treatment plan which includes medications and CXR with pt at bedside and pt agreed to plan.   4:24 PM-Pt rechecked and feels improved with medications listed above. She states that the chest tightness and swelling have improved. She is requesting discharge. Addressed symptoms to return for such as SOB, CP and cyanosis with pt and pt agreed to return for concerning symptoms.  Dg Chest 2 View  11/14/2012   *RADIOLOGY REPORT*  Clinical Data: Cough and shortness of breath.  CHEST - 2 VIEW  Comparison: 06/10/2012.  Findings: The cardiac silhouette, mediastinal and hilar contours are normal.  The lungs are clear.  No pleural effusion.  The bony thorax is intact.  IMPRESSION: Normal chest x-ray.   Original Report Authenticated By: Rudie Meyer, M.D.     1. Environmental allergies       MDM  I personally performed the services described in this documentation, which was scribed in my presence. The recorded information has been reviewed and is accurate.    Samantha Racer, MD 11/14/12 1910

## 2013-03-27 ENCOUNTER — Other Ambulatory Visit: Payer: Self-pay | Admitting: Ophthalmology

## 2013-12-14 ENCOUNTER — Encounter: Payer: Self-pay | Admitting: *Deleted

## 2013-12-15 ENCOUNTER — Ambulatory Visit (INDEPENDENT_AMBULATORY_CARE_PROVIDER_SITE_OTHER): Payer: Medicare PPO | Admitting: Internal Medicine

## 2013-12-15 ENCOUNTER — Encounter: Payer: Self-pay | Admitting: Internal Medicine

## 2013-12-15 VITALS — BP 132/74 | HR 61 | Ht 70.0 in | Wt 200.0 lb

## 2013-12-15 DIAGNOSIS — I159 Secondary hypertension, unspecified: Secondary | ICD-10-CM

## 2013-12-15 DIAGNOSIS — I158 Other secondary hypertension: Secondary | ICD-10-CM

## 2013-12-15 DIAGNOSIS — R079 Chest pain, unspecified: Secondary | ICD-10-CM

## 2013-12-15 NOTE — Patient Instructions (Signed)
Your physician recommends that you schedule a follow-up appointment in: to be determined  Your physician has requested that you have an echocardiogram. Echocardiography is a painless test that uses sound waves to create images of your heart. It provides your doctor with information about the size and shape of your heart and how well your heart's chambers and valves are working. This procedure takes approximately one hour. There are no restrictions for this procedure.    

## 2013-12-15 NOTE — Progress Notes (Signed)
HPI is a 68 yo who is referred for evaluation of CP  Patinet has no known history of CAD  She says over the past few months she has slowed down.  Does get some chest tightness with a flicker of discomfort in chest.  Has attrib to stress.   Taking are of mom.    Happens at other times  Strenuous work it does it.  Stops   Goes away Denies  PND Gets tired if tries to walk fast.  Dad died of MI at 4369  Sister with a murmur  SOB with activity  No palpitaitons  No edema.      Allergies  Allergen Reactions  . Aspirin Nausea And Vomiting    Current Outpatient Prescriptions  Medication Sig Dispense Refill  . amLODipine (NORVASC) 5 MG tablet Take 5 mg by mouth daily.      Marland Kitchen. atorvastatin (LIPITOR) 40 MG tablet Take 40 mg by mouth daily.      Marland Kitchen. escitalopram (LEXAPRO) 20 MG tablet Take 20 mg by mouth daily.      . meloxicam (MOBIC) 15 MG tablet Take 15 mg by mouth daily.      . Multiple Vitamin (MULTIVITAMIN WITH MINERALS) TABS Take 1 tablet by mouth daily.      . [DISCONTINUED] gabapentin (NEURONTIN) 100 MG capsule Take 100 mg by mouth 3 (three) times daily.         No current facility-administered medications for this visit.    Past Medical History  Diagnosis Date  . Hypertension   . Hypercholesteremia   . Cataract immature   . Arthritis     Past Surgical History  Procedure Laterality Date  . Oophorectomy    . Colonoscopy  09/08/2011    Procedure: COLONOSCOPY;  Surgeon: Dalia HeadingMark A Jenkins, MD;  Location: [facility];  Service: Gastroenterology;  Laterality: N/A;    No family history on file.  History   Social History  . Marital Status: Widowed    Spouse Name: N/A    Number of Children: N/A  . Years of Education: N/A   Occupational History  . Not on file.   Social History Main Topics  . Smoking status: Former Games developermoker  . Smokeless tobacco: Not on file  . Alcohol Use: No  . Drug Use: No  . Sexual Activity: Not on file   Other Topics Concern  . Not on file   Social  History Narrative  . No narrative on file    Review of Systems:  All systems reviewed.  They are negative to the above problem except as previously stated.  Vital Signs: BP 132/74  Pulse 61  Ht 5\' 10"  (1.778 m)  Wt 200 lb (90.719 kg)  BMI 28.70 kg/m2  Physical Exam Patient is in NAD HEENT:  Normocephalic, atraumatic. EOMI, PERRLA.  Neck: JVP is normal.  No bruits.  Lungs: clear to auscultation. No rales no wheezes.  Heart: Regular rate and rhythm. Normal S1, S2. No S3.   No significant murmurs. PMI not displaced.  Abdomen:  Supple, nontender. Normal bowel sounds. No masses. No hepatomegaly.  Extremities:   Good distal pulses throughout. No lower extremity edema.  Musculoskeletal :moving all extremities.  Neuro:   alert and oriented x3.  CN II-XII grossly intact.  EKG  SR 66    Assessment and Plan:  1.  Chest tightness/SOB  Would schedule patinet or echo to evaluate systolic and diastolic function.  If echo windows good would proceed iwht stress echo.  If not then sched for myoview Keep on current regimen.  2.  HL  WIl need to get lipids   3.  HTN  Adequate control.    F/U based on test results.

## 2013-12-19 ENCOUNTER — Ambulatory Visit (HOSPITAL_COMMUNITY)
Admission: RE | Admit: 2013-12-19 | Discharge: 2013-12-19 | Disposition: A | Discharge status: Home / Self Care | Payer: Medicare PPO | Source: Ambulatory Visit | Attending: Internal Medicine | Admitting: Internal Medicine

## 2013-12-19 ENCOUNTER — Telehealth: Payer: Self-pay

## 2013-12-19 DIAGNOSIS — R079 Chest pain, unspecified: Secondary | ICD-10-CM

## 2013-12-19 DIAGNOSIS — I517 Cardiomegaly: Secondary | ICD-10-CM

## 2013-12-19 NOTE — Telephone Encounter (Signed)
Left message on cell, will place order for stress echo and have printed patient instruction sheet

## 2013-12-19 NOTE — Progress Notes (Signed)
*  PRELIMINARY RESULTS* Echocardiogram 2D Echocardiogram has been performed.  Samantha Oliver, Samantha Oliver 12/19/2013, 12:32 PM

## 2013-12-19 NOTE — Telephone Encounter (Signed)
Message copied by Nori RiisARLTON, CATHERINE A on Tue Dec 19, 2013  4:42 PM ------      Message from: Pricilla RiffleOSS, PAULA V      Created: Tue Dec 19, 2013  2:40 PM       Echo shows normal LV systolic function      I would set up for stress echo to rule out inducible ishcmia. ------

## 2013-12-28 ENCOUNTER — Encounter (HOSPITAL_COMMUNITY): Payer: Self-pay

## 2013-12-28 ENCOUNTER — Ambulatory Visit (HOSPITAL_COMMUNITY)
Admission: RE | Admit: 2013-12-28 | Discharge: 2013-12-28 | Disposition: A | Discharge status: Home / Self Care | Payer: Medicare PPO | Source: Ambulatory Visit | Attending: Internal Medicine | Admitting: Internal Medicine

## 2013-12-28 DIAGNOSIS — R079 Chest pain, unspecified: Secondary | ICD-10-CM | POA: Insufficient documentation

## 2013-12-28 DIAGNOSIS — R072 Precordial pain: Secondary | ICD-10-CM

## 2013-12-28 NOTE — Progress Notes (Signed)
Stress Lab Nurses Notes - [facility]  Kingsley CallanderJane E Muise 12/28/2013 Reason for doing test: Chest Pain Type of test: Stress Echo Nurse performing test: Parke PoissonPhyllis Billingsly, RN Nuclear Medicine Tech: Not Applicable Echo Tech: Veda Canningindy Rigg MD performing test: Koneswaran/K.Lyman BishopLawrence NP Family MD: Phillips OdorGolding Test explained and consent signed: Yes.   IV started: No IV started Symptoms: fatigue Treatment/Intervention: None Reason test stopped: fatigue After recovery IV was: NA Patient to return to Nuc. Med at : NA Patient discharged: Home Patient's Condition upon discharge was: stable Comments: During test peak BP 179/58  & HR 160.  Recovery BP 129/49 & HR 90.  Symptoms resolved in recovery. Erskine SpeedBillingsley, Adrian Dinovo T

## 2013-12-28 NOTE — Progress Notes (Signed)
  Echocardiogram Echocardiogram Stress Test has been performed.  Conor Lata 12/28/2013, 9:51 AM

## 2014-01-31 ENCOUNTER — Other Ambulatory Visit (HOSPITAL_COMMUNITY): Payer: Self-pay | Admitting: Family Medicine

## 2014-01-31 DIAGNOSIS — Z1231 Encounter for screening mammogram for malignant neoplasm of breast: Secondary | ICD-10-CM

## 2014-02-02 ENCOUNTER — Ambulatory Visit (HOSPITAL_COMMUNITY)
Admission: RE | Admit: 2014-02-02 | Discharge: 2014-02-02 | Disposition: A | Discharge status: Home / Self Care | Payer: Medicare PPO | Source: Ambulatory Visit | Attending: Family Medicine | Admitting: Family Medicine

## 2014-02-02 DIAGNOSIS — Z1231 Encounter for screening mammogram for malignant neoplasm of breast: Secondary | ICD-10-CM | POA: Insufficient documentation

## 2014-02-22 ENCOUNTER — Ambulatory Visit (INDEPENDENT_AMBULATORY_CARE_PROVIDER_SITE_OTHER): Payer: Medicare PPO | Admitting: Orthopedic Surgery

## 2014-02-22 ENCOUNTER — Ambulatory Visit (INDEPENDENT_AMBULATORY_CARE_PROVIDER_SITE_OTHER): Payer: Medicare PPO

## 2014-02-22 VITALS — BP 133/86 | Ht 70.0 in | Wt 200.0 lb

## 2014-02-22 DIAGNOSIS — R229 Localized swelling, mass and lump, unspecified: Secondary | ICD-10-CM

## 2014-02-22 DIAGNOSIS — R2232 Localized swelling, mass and lump, left upper limb: Secondary | ICD-10-CM

## 2014-02-22 NOTE — Progress Notes (Signed)
New   Chief Complaint  Patient presents with  . Hand Problem    eval left hand knot...Samantha KitchenREF J.GOLDING    68 year old who presents with 3 weeks of pain on the dorsum of her hand radiating up into her left arm near the shoulder. Pain goes distal to proximal. She's tried some Aleve for a couple of days or a small wrist brace for a couple days didn't help so she sought medical treatment. Pain is increased with use pain is on the dorsum of the hand in the carpal joints associated with some swelling and intermittent mass formation.  Past Medical History  Diagnosis Date  . Hypertension   . Hypercholesteremia   . Cataract immature   . Arthritis    Past Surgical History  Procedure Laterality Date  . Oophorectomy    . Colonoscopy  09/08/2011    Procedure: COLONOSCOPY;  Surgeon: Dalia Heading, MD;  Location: [facility];  Service: Gastroenterology;  Laterality: N/A;   Allergies to aspirin but is really reaction causing stomach upset. Review of systems difficulty sleeping sinus problems dentures eyes visual disturbance. Joint pain. Depression. Otherwise normal.  BP 133/86  Ht  (1.778 m)  Wt 200 lb (90.719 kg)  BMI 28.70 kg/m2 General appearance is normal, the patient is alert and oriented x3 with normal mood and affect. Immature status is normal. No assistive devices.  Left wrist tenderness and granular feeling over the [facility] joint of the long metacarpal. Range of motion of the wrist is normal slightly painful no instability motor exam is normal scans intact pulses are good sensation is normal there is no lymphadenopathy there is some tenderness in the forearm.  X-rays show mild degenerative changes in the carpal bones including the [facility] joint of thumb and base of the metacarpal #3  Impression carpal Poss  Recommend wrist splint and Aleve for 6 weeks then recheck

## 2014-02-22 NOTE — Patient Instructions (Signed)
Wear brace for 6 weeks  Take aleve twice a day for 6 weeks

## 2014-04-05 ENCOUNTER — Ambulatory Visit (INDEPENDENT_AMBULATORY_CARE_PROVIDER_SITE_OTHER): Payer: Medicare PPO | Admitting: Orthopedic Surgery

## 2014-04-05 ENCOUNTER — Encounter: Payer: Self-pay | Admitting: Orthopedic Surgery

## 2014-04-05 VITALS — BP 149/75 | Ht 70.0 in | Wt 200.0 lb

## 2014-04-05 DIAGNOSIS — R2232 Localized swelling, mass and lump, left upper limb: Secondary | ICD-10-CM

## 2014-04-05 NOTE — Progress Notes (Signed)
Chief Complaint  Patient presents with  . Follow-up    6 week recheck on left hand.    F/u after splinting for carpal boss  After 6 weeks of splinting the patient has intermittent pain over the dorsum of her left hand has some pain with wrist extension wrist flexion  But she has improved and we would allow her to take her brace off his capsaicin cream 3 times a day followup as needed

## 2014-04-05 NOTE — Patient Instructions (Signed)
Brace off  capzacin cream 3 x a day

## 2014-07-11 ENCOUNTER — Emergency Department (HOSPITAL_COMMUNITY): Payer: Medicare PPO

## 2014-07-11 ENCOUNTER — Emergency Department (HOSPITAL_COMMUNITY)
Admission: EM | Admit: 2014-07-11 | Discharge: 2014-07-11 | Disposition: A | Discharge status: Home / Self Care | Payer: Medicare PPO | Attending: Emergency Medicine | Admitting: Emergency Medicine

## 2014-07-11 ENCOUNTER — Encounter (HOSPITAL_COMMUNITY): Payer: Self-pay | Admitting: *Deleted

## 2014-07-11 DIAGNOSIS — M1990 Unspecified osteoarthritis, unspecified site: Secondary | ICD-10-CM | POA: Diagnosis not present

## 2014-07-11 DIAGNOSIS — S8002XA Contusion of left knee, initial encounter: Secondary | ICD-10-CM | POA: Diagnosis not present

## 2014-07-11 DIAGNOSIS — Z87891 Personal history of nicotine dependence: Secondary | ICD-10-CM | POA: Diagnosis not present

## 2014-07-11 DIAGNOSIS — S8001XA Contusion of right knee, initial encounter: Secondary | ICD-10-CM | POA: Diagnosis not present

## 2014-07-11 DIAGNOSIS — Z8669 Personal history of other diseases of the nervous system and sense organs: Secondary | ICD-10-CM | POA: Insufficient documentation

## 2014-07-11 DIAGNOSIS — I10 Essential (primary) hypertension: Secondary | ICD-10-CM | POA: Diagnosis not present

## 2014-07-11 DIAGNOSIS — W19XXXA Unspecified fall, initial encounter: Secondary | ICD-10-CM

## 2014-07-11 DIAGNOSIS — Y9289 Other specified places as the place of occurrence of the external cause: Secondary | ICD-10-CM | POA: Insufficient documentation

## 2014-07-11 DIAGNOSIS — W108XXA Fall (on) (from) other stairs and steps, initial encounter: Secondary | ICD-10-CM | POA: Diagnosis not present

## 2014-07-11 DIAGNOSIS — E780 Pure hypercholesterolemia: Secondary | ICD-10-CM | POA: Diagnosis not present

## 2014-07-11 DIAGNOSIS — Y998 Other external cause status: Secondary | ICD-10-CM | POA: Diagnosis not present

## 2014-07-11 DIAGNOSIS — Z791 Long term (current) use of non-steroidal anti-inflammatories (NSAID): Secondary | ICD-10-CM | POA: Insufficient documentation

## 2014-07-11 DIAGNOSIS — Y9389 Activity, other specified: Secondary | ICD-10-CM | POA: Diagnosis not present

## 2014-07-11 DIAGNOSIS — Z79899 Other long term (current) drug therapy: Secondary | ICD-10-CM | POA: Insufficient documentation

## 2014-07-11 DIAGNOSIS — S8992XA Unspecified injury of left lower leg, initial encounter: Secondary | ICD-10-CM | POA: Diagnosis present

## 2014-07-11 MED ORDER — HYDROCODONE-ACETAMINOPHEN 5-325 MG PO TABS: 1.0000 | ORAL_TABLET | Freq: Four times a day (QID) | ORAL | Status: DC | PRN

## 2014-07-11 NOTE — ED Notes (Signed)
Pt states she missed last two steps while going downstairs and knees landed on cement. Bilateral knee pain with left being worse than the right. NAD. Pt is ambulatory without a limp

## 2014-07-11 NOTE — Discharge Instructions (Signed)
Continue to take the Aleve. The pain medication may make you sleepy. Return as needed.   Contusion A contusion is a deep bruise. Contusions happen when an injury causes bleeding under the skin. Signs of bruising include pain, puffiness (swelling), and discolored skin. The contusion may turn blue, purple, or yellow. HOME CARE   Put ice on the injured area.  Put ice in a plastic bag.  Place a towel between your skin and the bag.  Leave the ice on for 15-20 minutes, 03-04 times a day.  Only take medicine as told by your doctor.  Rest the injured area.  If possible, raise (elevate) the injured area to lessen puffiness. GET HELP RIGHT AWAY IF:   You have more bruising or puffiness.  You have pain that is getting worse.  Your puffiness or pain is not helped by medicine. MAKE SURE YOU:   Understand these instructions.  Will watch your condition.  Will get help right away if you are not doing well or get worse. Document Released: 11/25/2007 Document Revised: 08/31/2011 Document Reviewed: 04/13/2011 [facility] Patient Information 2015 [employer] [place]. This information is not intended to replace advice given to you by your health care provider. Make sure you discuss any questions you have with your health care provider.

## 2014-07-11 NOTE — ED Provider Notes (Signed)
CSN: [id number]     Arrival date & time 07/11/14  1455 History   First MD Initiated Contact with Patient 07/11/14 1609     Chief Complaint  Patient presents with  . Knee Pain     (Consider location/radiation/quality/duration/timing/severity/associated sxs/prior Treatment) Patient is a 69 y.o. female presenting with knee pain. The history is provided by the patient.  Knee Pain Location:  Knee Time since incident:  1 day Injury: yes   Mechanism of injury: fall   Fall:    Fall occurred:  Down stairs   Impact surface:  Primary school teacher of impact:  Knees   Entrapped after fall: no   Knee location:  L knee and R knee Pain details:    Quality:  Throbbing and aching   Radiates to:  Does not radiate   Severity:  Moderate   Onset quality:  Sudden   Timing:  Constant   Progression:  Unchanged Chronicity:  New Dislocation: no   Foreign body present:  No foreign bodies Prior injury to area:  No Relieved by: improved with Aleve. Worsened by:  Bearing weight  RISA AUMAN is a 69 y.o. female who presents to the ED with bilateral knee pain s/p fall last PM. She has been taking Aleve and it does help some. She denies any other injuries.   Past Medical History  Diagnosis Date  . Hypertension   . Hypercholesteremia   . Cataract immature   . Arthritis    Past Surgical History  Procedure Laterality Date  . Oophorectomy    . Colonoscopy  09/08/2011    Procedure: COLONOSCOPY;  Surgeon: Dalia Heading, MD;  Location: [facility];  Service: Gastroenterology;  Laterality: N/A;   Family History  Problem Relation Age of Onset  . Heart attack Father    History  Substance Use Topics  . Smoking status: Former Games developer  . Smokeless tobacco: Not on file  . Alcohol Use: No   OB History    No data available     Review of Systems Negative except as stated in HPI   Allergies  Aspirin  Home Medications   Prior to Admission medications   Medication Sig Start Date End Date  Taking? Authorizing Provider  amLODipine (NORVASC) 5 MG tablet Take 5 mg by mouth daily.    Historical Provider, MD  atorvastatin (LIPITOR) 40 MG tablet Take 40 mg by mouth daily.    Historical Provider, MD  escitalopram (LEXAPRO) 20 MG tablet Take 20 mg by mouth daily.    Historical Provider, MD  HYDROcodone-acetaminophen (NORCO) 5-325 MG per tablet Take 1 tablet by mouth every 6 (six) hours as needed for severe pain. 07/11/14   Sheylin Scharnhorst Orlene Och, NP  meloxicam (MOBIC) 15 MG tablet Take 15 mg by mouth daily.    Historical Provider, MD  Multiple Vitamin (MULTIVITAMIN WITH MINERALS) TABS Take 1 tablet by mouth daily.    Historical Provider, MD   BP 165/82 mmHg  Pulse 67  Temp(Src) 98.7 F (37.1 C) (Oral)  Resp 16  Ht  (1.778 m)  Wt 198 lb (89.812 kg)  BMI 28.41 kg/m2  SpO2 100% Physical Exam  Constitutional: She is oriented to person, place, and time. She appears well-developed and well-nourished.  HENT:  Head: Normocephalic and atraumatic.  Eyes: EOM are normal.  Neck: Neck supple.  Cardiovascular: Normal rate.   Pulmonary/Chest: Effort normal.  Musculoskeletal: Normal range of motion.       Right knee: She exhibits normal  range of motion, no ecchymosis, no deformity, no laceration, no erythema, normal alignment and no MCL laxity. Tenderness found.       Left knee: She exhibits normal range of motion, no ecchymosis, no deformity, no laceration, no erythema, normal alignment and no LCL laxity. Swelling: minimal. Tenderness found.       Legs: Pedal pulses equal, adequate circulation, good touch sensation. Mild pain with flexion of the knees, otherwise full passive range of motion without pain.   Neurological: She is alert and oriented to person, place, and time. No cranial nerve deficit.  Skin: Skin is warm and dry.  Psychiatric: She has a normal mood and affect. Her behavior is normal.  Nursing note and vitals reviewed.   ED Course  Procedures (including critical care time) Labs  Review Labs Reviewed - No data to display  Imaging Review Dg Knee Complete 4 Views Left  07/11/2014   CLINICAL DATA:  69 year old female who fell on steps landing on cement. Pain greater on the right side. Initial encounter.  EXAM: LEFT KNEE - COMPLETE 4+ VIEW  COMPARISON:  None.  FINDINGS: Bone mineralization is within normal limits for age. Patella intact. Tricompartmental degenerative spurring maximal in the medial compartment. No acute fracture or dislocation identified. Possible small joint effusion.  IMPRESSION: Degenerative changes and possible small joint effusion. No acute fracture or dislocation identified about the left knee.   Electronically Signed   By: Augusto GambleLee  Hall M.D.   On: 07/11/2014 15:27   Dg Knee Complete 4 Views Right  07/11/2014   CLINICAL DATA:  69 year old female who fell going down stairs onto cement with right greater than left knee pain. Initial encounter.  EXAM: RIGHT KNEE - COMPLETE 4+ VIEW  COMPARISON:  None.  FINDINGS: Small suprapatellar joint effusion. Bone mineralization is within normal limits for age. Tricompartmental degenerative spurring, severe in the lateral compartment. Patella intact. No fracture or dislocation identified.  IMPRESSION: Small joint effusion and degenerative changes, severe in the lateral compartment. No acute fracture or dislocation identified.   Electronically Signed   By: Augusto GambleLee  Hall M.D.   On: 07/11/2014 15:28    MDM  69 y.o. female with bilateral knee pain s/p fall yesterday. Ace wrap applied to the left knee, ice, rest and follow up with PCP or return here as needed for worsening symptoms. Stable for discharge without neurovascular deficits. I have reviewed this patient's vital signs, nurses notes, appropriate imaging and discussed findings and plan of care with the patient. She voices understanding and agrees with plan.     Final diagnoses:  Fall  Contusion, knee, left, initial encounter  Contusion, knee, right, initial encounter      [facility] Emaline Karnes, NP 07/11/14 1629  Rolland PorterMark James, MD 07/14/14 [phone]

## 2014-07-17 MED FILL — Hydrocodone-Acetaminophen Tab 5-325 MG: ORAL | Qty: 6 | Status: AC

## 2014-11-23 DIAGNOSIS — M79671 Pain in right foot: Secondary | ICD-10-CM | POA: Diagnosis not present

## 2014-11-23 DIAGNOSIS — D2371 Other benign neoplasm of skin of right lower limb, including hip: Secondary | ICD-10-CM | POA: Diagnosis not present

## 2014-12-06 DIAGNOSIS — D489 Neoplasm of uncertain behavior, unspecified: Secondary | ICD-10-CM | POA: Diagnosis not present

## 2014-12-06 DIAGNOSIS — L720 Epidermal cyst: Secondary | ICD-10-CM | POA: Diagnosis not present

## 2015-02-04 DIAGNOSIS — R7309 Other abnormal glucose: Secondary | ICD-10-CM | POA: Diagnosis not present

## 2015-02-04 DIAGNOSIS — E782 Mixed hyperlipidemia: Secondary | ICD-10-CM | POA: Diagnosis not present

## 2015-02-04 DIAGNOSIS — Z6828 Body mass index (BMI) 28.0-28.9, adult: Secondary | ICD-10-CM | POA: Diagnosis not present

## 2015-02-04 DIAGNOSIS — Z1389 Encounter for screening for other disorder: Secondary | ICD-10-CM | POA: Diagnosis not present

## 2015-02-04 DIAGNOSIS — I10 Essential (primary) hypertension: Secondary | ICD-10-CM | POA: Diagnosis not present

## 2015-02-04 DIAGNOSIS — E663 Overweight: Secondary | ICD-10-CM | POA: Diagnosis not present

## 2015-03-07 ENCOUNTER — Other Ambulatory Visit (HOSPITAL_COMMUNITY): Payer: Self-pay | Admitting: Family Medicine

## 2015-03-07 DIAGNOSIS — Z1231 Encounter for screening mammogram for malignant neoplasm of breast: Secondary | ICD-10-CM

## 2015-03-15 ENCOUNTER — Ambulatory Visit (HOSPITAL_COMMUNITY)
Admission: RE | Admit: 2015-03-15 | Discharge: 2015-03-15 | Disposition: A | Discharge status: Home / Self Care | Payer: Medicare PPO | Source: Ambulatory Visit | Attending: Family Medicine | Admitting: Family Medicine

## 2015-03-15 DIAGNOSIS — Z1231 Encounter for screening mammogram for malignant neoplasm of breast: Secondary | ICD-10-CM

## 2015-05-11 DIAGNOSIS — J329 Chronic sinusitis, unspecified: Secondary | ICD-10-CM | POA: Diagnosis not present

## 2015-05-11 DIAGNOSIS — B9789 Other viral agents as the cause of diseases classified elsewhere: Secondary | ICD-10-CM | POA: Diagnosis not present

## 2015-05-11 DIAGNOSIS — R05 Cough: Secondary | ICD-10-CM | POA: Diagnosis not present

## 2015-06-18 ENCOUNTER — Encounter (HOSPITAL_COMMUNITY): Payer: Self-pay | Admitting: *Deleted

## 2015-06-18 ENCOUNTER — Emergency Department (HOSPITAL_COMMUNITY)
Admission: EM | Admit: 2015-06-18 | Discharge: 2015-06-18 | Disposition: A | Discharge status: Home / Self Care | Payer: Medicare PPO | Attending: Emergency Medicine | Admitting: Emergency Medicine

## 2015-06-18 ENCOUNTER — Emergency Department (HOSPITAL_COMMUNITY): Payer: Medicare PPO

## 2015-06-18 DIAGNOSIS — E7800 Pure hypercholesterolemia, unspecified: Secondary | ICD-10-CM | POA: Insufficient documentation

## 2015-06-18 DIAGNOSIS — M1990 Unspecified osteoarthritis, unspecified site: Secondary | ICD-10-CM | POA: Insufficient documentation

## 2015-06-18 DIAGNOSIS — S79912A Unspecified injury of left hip, initial encounter: Secondary | ICD-10-CM | POA: Diagnosis not present

## 2015-06-18 DIAGNOSIS — Z791 Long term (current) use of non-steroidal anti-inflammatories (NSAID): Secondary | ICD-10-CM | POA: Insufficient documentation

## 2015-06-18 DIAGNOSIS — W19XXXA Unspecified fall, initial encounter: Secondary | ICD-10-CM

## 2015-06-18 DIAGNOSIS — Y9289 Other specified places as the place of occurrence of the external cause: Secondary | ICD-10-CM | POA: Diagnosis not present

## 2015-06-18 DIAGNOSIS — W108XXA Fall (on) (from) other stairs and steps, initial encounter: Secondary | ICD-10-CM | POA: Insufficient documentation

## 2015-06-18 DIAGNOSIS — Y998 Other external cause status: Secondary | ICD-10-CM | POA: Diagnosis not present

## 2015-06-18 DIAGNOSIS — Z87891 Personal history of nicotine dependence: Secondary | ICD-10-CM | POA: Insufficient documentation

## 2015-06-18 DIAGNOSIS — H269 Unspecified cataract: Secondary | ICD-10-CM | POA: Diagnosis not present

## 2015-06-18 DIAGNOSIS — I10 Essential (primary) hypertension: Secondary | ICD-10-CM | POA: Diagnosis not present

## 2015-06-18 DIAGNOSIS — Z79899 Other long term (current) drug therapy: Secondary | ICD-10-CM | POA: Insufficient documentation

## 2015-06-18 DIAGNOSIS — S7002XA Contusion of left hip, initial encounter: Secondary | ICD-10-CM | POA: Insufficient documentation

## 2015-06-18 DIAGNOSIS — S8991XA Unspecified injury of right lower leg, initial encounter: Secondary | ICD-10-CM | POA: Insufficient documentation

## 2015-06-18 DIAGNOSIS — Y9389 Activity, other specified: Secondary | ICD-10-CM | POA: Insufficient documentation

## 2015-06-18 DIAGNOSIS — M25561 Pain in right knee: Secondary | ICD-10-CM | POA: Diagnosis not present

## 2015-06-18 MED ORDER — NAPROXEN 250 MG PO TABS
500.0000 mg | ORAL_TABLET | Freq: Once | ORAL | Status: AC
  Administered 2015-06-18: 500 mg via ORAL
  Filled 2015-06-18: qty 2

## 2015-06-18 MED ORDER — NAPROXEN 500 MG PO TABS: 500.0000 mg | ORAL_TABLET | Freq: Two times a day (BID) | ORAL | Status: DC

## 2015-06-18 NOTE — Discharge Instructions (Signed)
Fall Prevention in the Home  Falls can cause injuries and can affect people from all age groups. There are many simple things that you can do to make your home safe and to help prevent falls. WHAT CAN I DO ON THE OUTSIDE OF MY HOME?  Regularly repair the edges of walkways and driveways and fix any cracks.  Remove high doorway thresholds.  Trim any shrubbery on the main path into your home.  Use bright outdoor lighting.  Clear walkways of debris and clutter, including tools and rocks.  Regularly check that handrails are securely fastened and in good repair. Both sides of any steps should have handrails.  Install guardrails along the edges of any raised decks or porches.  Have leaves, snow, and ice cleared regularly.  Use sand or salt on walkways during winter months.  In the garage, clean up any spills right away, including grease or oil spills. WHAT CAN I DO IN THE BATHROOM?  Use night lights.  Install grab bars by the toilet and in the tub and shower. Do not use towel bars as grab bars.  Use non-skid mats or decals on the floor of the tub or shower.  If you need to sit down while you are in the shower, use a plastic, non-slip stool..  Keep the floor dry. Immediately clean up any water that spills on the floor.  Remove soap buildup in the tub or shower on a regular basis.  Attach bath mats securely with double-sided non-slip rug tape.  Remove throw rugs and other tripping hazards from the floor. WHAT CAN I DO IN THE BEDROOM?  Use night lights.  Make sure that a bedside light is easy to reach.  Do not use oversized bedding that drapes onto the floor.  Have a firm chair that has side arms to use for getting dressed.  Remove throw rugs and other tripping hazards from the floor. WHAT CAN I DO IN THE KITCHEN?   Clean up any spills right away.  Avoid walking on wet floors.  Place frequently used items in easy-to-reach places.  If you need to reach for something  above you, use a sturdy step stool that has a grab bar.  Keep electrical cables out of the way.  Do not use floor polish or wax that makes floors slippery. If you have to use wax, make sure that it is non-skid floor wax.  Remove throw rugs and other tripping hazards from the floor. WHAT CAN I DO IN THE STAIRWAYS?  Do not leave any items on the stairs.  Make sure that there are handrails on both sides of the stairs. Fix handrails that are broken or loose. Make sure that handrails are as long as the stairways.  Check any carpeting to make sure that it is firmly attached to the stairs. Fix any carpet that is loose or worn.  Avoid having throw rugs at the top or bottom of stairways, or secure the rugs with carpet tape to prevent them from moving.  Make sure that you have a light switch at the top of the stairs and the bottom of the stairs. If you do not have them, have them installed. WHAT ARE SOME OTHER FALL PREVENTION TIPS?  Wear closed-toe shoes that fit well and support your feet. Wear shoes that have rubber soles or low heels.  When you use a stepladder, make sure that it is completely opened and that the sides are firmly locked. Have someone hold the ladder while you   are using it. Do not climb a closed stepladder.  Add color or contrast paint or tape to grab bars and handrails in your home. Place contrasting color strips on the first and last steps.  Use mobility aids as needed, such as canes, walkers, scooters, and crutches.  Turn on lights if it is dark. Replace any light bulbs that burn out.  Set up furniture so that there are clear paths. Keep the furniture in the same spot.  Fix any uneven floor surfaces.  Choose a carpet design that does not hide the edge of steps of a stairway.  Be aware of any and all pets.  Review your medicines with your healthcare provider. Some medicines can cause dizziness or changes in blood pressure, which increase your risk of falling. Talk  with your health care provider about other ways that you can decrease your risk of falls. This may include working with a physical therapist or trainer to improve your strength, balance, and endurance.   This information is not intended to replace advice given to you by your health care provider. Make sure you discuss any questions you have with your health care provider.   Document Released: 05/29/2002 Document Revised: 10/23/2014 Document Reviewed: 07/13/2014 Elsevier Interactive Patient Education 2016 [employer]  

## 2015-06-18 NOTE — ED Provider Notes (Signed)
CSN: [id number]     Arrival date & time 06/18/15  0705 History   First MD Initiated Contact with Patient 06/18/15 [id number]     Chief Complaint  Patient presents with  . Leg Pain     (Consider location/radiation/quality/duration/timing/severity/associated sxs/prior Treatment) HPI Comments: Mechanical fall yesterday while descending 3 steps outside. She stumbled on the last step and fell onto her right knee and twisted her left hip. Did not hit head or lose consciousness. Complains of pain left hip and right knee. Denies any head, neck, back pain. She does not take any blood thinners. No chest pain or shortness of breath. She is able to ambulate and drove herself to the hospital. She took some Tylenol without relief.  The history is provided by the patient.    Past Medical History  Diagnosis Date  . Hypertension   . Hypercholesteremia   . Cataract immature   . Arthritis    Past Surgical History  Procedure Laterality Date  . Oophorectomy    . Colonoscopy  09/08/2011    Procedure: COLONOSCOPY;  Surgeon: Dalia HeadingMark A Jenkins, MD;  Location: [facility];  Service: Gastroenterology;  Laterality: N/A;   Family History  Problem Relation Age of Onset  . Heart attack Father    Social History  Substance Use Topics  . Smoking status: Former Games developermoker  . Smokeless tobacco: None  . Alcohol Use: No   OB History    No data available     Review of Systems  Constitutional: Negative for fever, activity change and appetite change.  HENT: Negative for congestion and rhinorrhea.   Respiratory: Negative for cough, chest tightness and shortness of breath.   Cardiovascular: Negative for chest pain.  Gastrointestinal: Negative for nausea, vomiting and abdominal pain.  Genitourinary: Negative for dysuria and hematuria.  Musculoskeletal: Positive for myalgias and arthralgias. Negative for back pain and neck pain.  Skin: Negative for rash.  Neurological: Negative for dizziness, weakness, light-headedness  and headaches.  A complete 10 system review of systems was obtained and all systems are negative except as noted in the HPI and PMH.      Allergies  Aspirin  Home Medications   Prior to Admission medications   Medication Sig Start Date End Date Taking? Authorizing Provider  amLODipine (NORVASC) 5 MG tablet Take 5 mg by mouth daily.    Historical Provider, MD  atorvastatin (LIPITOR) 40 MG tablet Take 40 mg by mouth daily.    Historical Provider, MD  escitalopram (LEXAPRO) 20 MG tablet Take 20 mg by mouth daily.    Historical Provider, MD  HYDROcodone-acetaminophen (NORCO) 5-325 MG per tablet Take 1 tablet by mouth every 6 (six) hours as needed for severe pain. 07/11/14   Hope Orlene OchM Neese, NP  meloxicam (MOBIC) 15 MG tablet Take 15 mg by mouth daily.    Historical Provider, MD  Multiple Vitamin (MULTIVITAMIN WITH MINERALS) TABS Take 1 tablet by mouth daily.    Historical Provider, MD  naproxen (NAPROSYN) 500 MG tablet Take 1 tablet (500 mg total) by mouth 2 (two) times daily. 06/18/15   Glynn OctaveStephen Mazal Ebey, MD   BP 133/57 mmHg  Pulse 72  Temp(Src) 98.6 F (37 C) (Oral)  Resp 18  Ht 5\' 10"  (1.778 m)  Wt 198 lb (89.812 kg)  BMI 28.41 kg/m2  SpO2 97% Physical Exam  Constitutional: She is oriented to person, place, and time. She appears well-developed and well-nourished. No distress.  HENT:  Head: Normocephalic and atraumatic.  Mouth/Throat: Oropharynx is clear  and moist. No oropharyngeal exudate.  Eyes: Conjunctivae and EOM are normal. Pupils are equal, round, and reactive to light.  Neck: Normal range of motion. Neck supple.  No meningismus.  Cardiovascular: Normal rate, regular rhythm, normal heart sounds and intact distal pulses.   No murmur heard. Pulmonary/Chest: Effort normal and breath sounds normal. No respiratory distress.  Abdominal: Soft. There is no tenderness. There is no rebound and no guarding.  Musculoskeletal: Normal range of motion. She exhibits tenderness. She exhibits  no edema.  No appreciable right knee effusion. Full range of motion of right hip and right knee. Intact DP and PT pulses. Pain with range of motion of left hip. There is no obvious deformity. Full range of motion of left knee without pain. No CT or L-spine tenderness.  Neurological: She is alert and oriented to person, place, and time. No cranial nerve deficit. She exhibits normal muscle tone. Coordination normal.  No ataxia on finger to nose bilaterally. No pronator drift. 5/5 strength throughout. CN 2-12 intact.Equal grip strength. Sensation intact.   Skin: Skin is warm.  Psychiatric: She has a normal mood and affect. Her behavior is normal.  Nursing note and vitals reviewed.   ED Course  Procedures (including critical care time) Labs Review Labs Reviewed - No data to display  Imaging Review Dg Knee Complete 4 Views Right  06/18/2015  CLINICAL DATA:  Post fall yesterday down flight of stairs, now with right knee pain. EXAM: RIGHT KNEE - COMPLETE 4+ VIEW COMPARISON:  07/11/2014; 12/01/2014 FINDINGS: No fracture or dislocation. Moderate tricompartmental degenerative change of the knee with joint space loss, subchondral sclerosis and osteophytosis, most conspicuous within the medial compartment. A small amount of chondrocalcinosis noted both the medial and lateral joint spaces. No joint effusion. Regional soft tissues appear normal. No radiopaque foreign body. IMPRESSION: 1. No acute findings. 2. Moderate tricompartmental degenerative change of the knee. 3. Chondrocalcinosis suggestive of CPPD. Electronically Signed   By: Simonne Come M.D.   On: 06/18/2015 08:04   Dg Hip Unilat With Pelvis 2-3 Views Left  06/18/2015  CLINICAL DATA:  69 year old female status post fall down a flight of stairs yesterday EXAM: DG HIP (WITH OR WITHOUT PELVIS) 2-3V LEFT COMPARISON:  Concurrently obtained radiographs of the right lower extremity FINDINGS: There is no evidence of hip fracture or dislocation. There is no  evidence of arthropathy or other focal bone abnormality. Degenerative disc disease in the visualized lower lumbar spine. IMPRESSION: Negative. Electronically Signed   By: Malachy Moan M.D.   On: 06/18/2015 08:04   I have personally reviewed and evaluated these images and lab results as part of my medical decision-making.   EKG Interpretation None      MDM   Final diagnoses:  Contusion, hip, left, initial encounter  Fall, initial encounter   Mechanical fall with right knee and left hip pain. No head or neck injury. No loss of consciousness. Able to ambulate.  Neurovascularly intact. X-rays are negative for acute traumatic injury. Mild arthritis of right knee. Patient's pain is controlled. She is able to ambulate without assistance.  We'll treat supportively with anti-inflammatories, follow-up with PCP. Return precautions discussed.   Glynn Octave, MD 06/18/15 [phone]

## 2015-06-18 NOTE — ED Notes (Signed)
Pt able to ambulate in hallway. Reports pain in left hip improved after pain medication.

## 2015-06-18 NOTE — ED Notes (Signed)
Pt c/o left leg pain after tripping and falling down stairs yesterday. States she landed on her knees, denies hitting head. Pain is from left hip down to left knee and right knee hurts as well.

## 2015-06-21 DIAGNOSIS — R5383 Other fatigue: Secondary | ICD-10-CM | POA: Diagnosis not present

## 2015-06-21 DIAGNOSIS — M1711 Unilateral primary osteoarthritis, right knee: Secondary | ICD-10-CM | POA: Diagnosis not present

## 2015-06-21 DIAGNOSIS — E663 Overweight: Secondary | ICD-10-CM | POA: Diagnosis not present

## 2015-06-21 DIAGNOSIS — Z79899 Other long term (current) drug therapy: Secondary | ICD-10-CM | POA: Diagnosis not present

## 2015-06-21 DIAGNOSIS — Z23 Encounter for immunization: Secondary | ICD-10-CM | POA: Diagnosis not present

## 2015-06-21 DIAGNOSIS — I10 Essential (primary) hypertension: Secondary | ICD-10-CM | POA: Diagnosis not present

## 2015-06-21 DIAGNOSIS — E782 Mixed hyperlipidemia: Secondary | ICD-10-CM | POA: Diagnosis not present

## 2015-06-21 DIAGNOSIS — Z6827 Body mass index (BMI) 27.0-27.9, adult: Secondary | ICD-10-CM | POA: Diagnosis not present

## 2015-06-21 DIAGNOSIS — Z1389 Encounter for screening for other disorder: Secondary | ICD-10-CM | POA: Diagnosis not present

## 2015-08-13 DIAGNOSIS — Z6829 Body mass index (BMI) 29.0-29.9, adult: Secondary | ICD-10-CM | POA: Diagnosis not present

## 2015-08-13 DIAGNOSIS — J069 Acute upper respiratory infection, unspecified: Secondary | ICD-10-CM | POA: Diagnosis not present

## 2015-08-13 DIAGNOSIS — Z1389 Encounter for screening for other disorder: Secondary | ICD-10-CM | POA: Diagnosis not present

## 2015-08-13 DIAGNOSIS — J209 Acute bronchitis, unspecified: Secondary | ICD-10-CM | POA: Diagnosis not present

## 2015-08-13 DIAGNOSIS — E663 Overweight: Secondary | ICD-10-CM | POA: Diagnosis not present

## 2015-10-03 DIAGNOSIS — Z1389 Encounter for screening for other disorder: Secondary | ICD-10-CM | POA: Diagnosis not present

## 2015-10-03 DIAGNOSIS — Z0000 Encounter for general adult medical examination without abnormal findings: Secondary | ICD-10-CM | POA: Diagnosis not present

## 2015-10-03 DIAGNOSIS — Z6829 Body mass index (BMI) 29.0-29.9, adult: Secondary | ICD-10-CM | POA: Diagnosis not present

## 2015-10-03 DIAGNOSIS — Z23 Encounter for immunization: Secondary | ICD-10-CM | POA: Diagnosis not present

## 2015-10-10 DIAGNOSIS — E782 Mixed hyperlipidemia: Secondary | ICD-10-CM | POA: Diagnosis not present

## 2015-10-10 DIAGNOSIS — Z1389 Encounter for screening for other disorder: Secondary | ICD-10-CM | POA: Diagnosis not present

## 2015-10-10 DIAGNOSIS — Z6828 Body mass index (BMI) 28.0-28.9, adult: Secondary | ICD-10-CM | POA: Diagnosis not present

## 2015-10-10 DIAGNOSIS — R7309 Other abnormal glucose: Secondary | ICD-10-CM | POA: Diagnosis not present

## 2015-10-10 DIAGNOSIS — I10 Essential (primary) hypertension: Secondary | ICD-10-CM | POA: Diagnosis not present

## 2015-11-22 DIAGNOSIS — Z0100 Encounter for examination of eyes and vision without abnormal findings: Secondary | ICD-10-CM | POA: Diagnosis not present

## 2015-11-22 DIAGNOSIS — H25013 Cortical age-related cataract, bilateral: Secondary | ICD-10-CM | POA: Diagnosis not present

## 2015-11-22 DIAGNOSIS — H2513 Age-related nuclear cataract, bilateral: Secondary | ICD-10-CM | POA: Diagnosis not present

## 2015-12-03 DIAGNOSIS — H2512 Age-related nuclear cataract, left eye: Secondary | ICD-10-CM | POA: Diagnosis not present

## 2015-12-03 DIAGNOSIS — H25012 Cortical age-related cataract, left eye: Secondary | ICD-10-CM | POA: Diagnosis not present

## 2015-12-03 DIAGNOSIS — H25812 Combined forms of age-related cataract, left eye: Secondary | ICD-10-CM | POA: Diagnosis not present

## 2016-01-21 DIAGNOSIS — H25811 Combined forms of age-related cataract, right eye: Secondary | ICD-10-CM | POA: Diagnosis not present

## 2016-01-21 DIAGNOSIS — H2511 Age-related nuclear cataract, right eye: Secondary | ICD-10-CM | POA: Diagnosis not present

## 2016-01-21 DIAGNOSIS — H25011 Cortical age-related cataract, right eye: Secondary | ICD-10-CM | POA: Diagnosis not present

## 2016-03-12 DIAGNOSIS — Z1389 Encounter for screening for other disorder: Secondary | ICD-10-CM | POA: Diagnosis not present

## 2016-03-12 DIAGNOSIS — Z6828 Body mass index (BMI) 28.0-28.9, adult: Secondary | ICD-10-CM | POA: Diagnosis not present

## 2016-03-12 DIAGNOSIS — J069 Acute upper respiratory infection, unspecified: Secondary | ICD-10-CM | POA: Diagnosis not present

## 2016-03-12 DIAGNOSIS — E559 Vitamin D deficiency, unspecified: Secondary | ICD-10-CM | POA: Diagnosis not present

## 2016-03-12 DIAGNOSIS — E782 Mixed hyperlipidemia: Secondary | ICD-10-CM | POA: Diagnosis not present

## 2016-03-12 DIAGNOSIS — R7309 Other abnormal glucose: Secondary | ICD-10-CM | POA: Diagnosis not present

## 2016-03-12 DIAGNOSIS — Z23 Encounter for immunization: Secondary | ICD-10-CM | POA: Diagnosis not present

## 2016-03-12 DIAGNOSIS — E663 Overweight: Secondary | ICD-10-CM | POA: Diagnosis not present

## 2016-03-12 DIAGNOSIS — I10 Essential (primary) hypertension: Secondary | ICD-10-CM | POA: Diagnosis not present

## 2016-04-03 ENCOUNTER — Other Ambulatory Visit (HOSPITAL_COMMUNITY): Payer: Self-pay | Admitting: Family Medicine

## 2016-04-03 DIAGNOSIS — Z1231 Encounter for screening mammogram for malignant neoplasm of breast: Secondary | ICD-10-CM

## 2016-04-14 ENCOUNTER — Ambulatory Visit (HOSPITAL_COMMUNITY): Payer: Self-pay

## 2016-04-22 ENCOUNTER — Ambulatory Visit (HOSPITAL_COMMUNITY): Payer: Self-pay

## 2016-06-04 DIAGNOSIS — E663 Overweight: Secondary | ICD-10-CM | POA: Diagnosis not present

## 2016-06-04 DIAGNOSIS — J069 Acute upper respiratory infection, unspecified: Secondary | ICD-10-CM | POA: Diagnosis not present

## 2016-06-04 DIAGNOSIS — Z6829 Body mass index (BMI) 29.0-29.9, adult: Secondary | ICD-10-CM | POA: Diagnosis not present

## 2016-06-04 DIAGNOSIS — J0100 Acute maxillary sinusitis, unspecified: Secondary | ICD-10-CM | POA: Diagnosis not present

## 2016-06-04 DIAGNOSIS — Z1389 Encounter for screening for other disorder: Secondary | ICD-10-CM | POA: Diagnosis not present

## 2016-08-15 DIAGNOSIS — Z20828 Contact with and (suspected) exposure to other viral communicable diseases: Secondary | ICD-10-CM | POA: Diagnosis not present

## 2016-08-15 DIAGNOSIS — J329 Chronic sinusitis, unspecified: Secondary | ICD-10-CM | POA: Diagnosis not present

## 2016-08-20 DIAGNOSIS — R109 Unspecified abdominal pain: Secondary | ICD-10-CM | POA: Diagnosis not present

## 2016-08-20 DIAGNOSIS — Z1389 Encounter for screening for other disorder: Secondary | ICD-10-CM | POA: Diagnosis not present

## 2016-08-20 DIAGNOSIS — E782 Mixed hyperlipidemia: Secondary | ICD-10-CM | POA: Diagnosis not present

## 2016-08-20 DIAGNOSIS — R7309 Other abnormal glucose: Secondary | ICD-10-CM | POA: Diagnosis not present

## 2016-08-20 DIAGNOSIS — I10 Essential (primary) hypertension: Secondary | ICD-10-CM | POA: Diagnosis not present

## 2016-08-20 DIAGNOSIS — Z6829 Body mass index (BMI) 29.0-29.9, adult: Secondary | ICD-10-CM | POA: Diagnosis not present

## 2016-11-09 ENCOUNTER — Other Ambulatory Visit (HOSPITAL_COMMUNITY): Payer: Self-pay | Admitting: Family Medicine

## 2016-11-09 ENCOUNTER — Ambulatory Visit (HOSPITAL_COMMUNITY)
Admission: RE | Admit: 2016-11-09 | Discharge: 2016-11-09 | Disposition: A | Discharge status: Home / Self Care | Payer: Medicare PPO | Source: Ambulatory Visit | Attending: Family Medicine | Admitting: Family Medicine

## 2016-11-09 DIAGNOSIS — M25572 Pain in left ankle and joints of left foot: Secondary | ICD-10-CM | POA: Diagnosis not present

## 2016-11-09 DIAGNOSIS — M7732 Calcaneal spur, left foot: Secondary | ICD-10-CM | POA: Diagnosis not present

## 2016-11-09 DIAGNOSIS — Z6829 Body mass index (BMI) 29.0-29.9, adult: Secondary | ICD-10-CM | POA: Diagnosis not present

## 2016-11-09 DIAGNOSIS — R2242 Localized swelling, mass and lump, left lower limb: Secondary | ICD-10-CM | POA: Diagnosis not present

## 2016-12-15 DIAGNOSIS — Z0000 Encounter for general adult medical examination without abnormal findings: Secondary | ICD-10-CM | POA: Diagnosis not present

## 2016-12-15 DIAGNOSIS — Z6831 Body mass index (BMI) 31.0-31.9, adult: Secondary | ICD-10-CM | POA: Diagnosis not present

## 2016-12-15 DIAGNOSIS — Z1389 Encounter for screening for other disorder: Secondary | ICD-10-CM | POA: Diagnosis not present

## 2017-01-19 DIAGNOSIS — M7752 Other enthesopathy of left foot: Secondary | ICD-10-CM | POA: Diagnosis not present

## 2017-02-09 DIAGNOSIS — M7752 Other enthesopathy of left foot: Secondary | ICD-10-CM | POA: Diagnosis not present

## 2017-03-02 DIAGNOSIS — M7752 Other enthesopathy of left foot: Secondary | ICD-10-CM | POA: Diagnosis not present

## 2017-05-11 ENCOUNTER — Other Ambulatory Visit (HOSPITAL_COMMUNITY): Payer: Self-pay | Admitting: Family Medicine

## 2017-05-11 DIAGNOSIS — Z1231 Encounter for screening mammogram for malignant neoplasm of breast: Secondary | ICD-10-CM

## 2017-05-26 ENCOUNTER — Ambulatory Visit (HOSPITAL_COMMUNITY): Payer: Self-pay

## 2017-05-27 ENCOUNTER — Ambulatory Visit (HOSPITAL_COMMUNITY)
Admission: RE | Admit: 2017-05-27 | Discharge: 2017-05-27 | Disposition: A | Discharge status: Home / Self Care | Payer: Medicare PPO | Source: Ambulatory Visit | Attending: Family Medicine | Admitting: Family Medicine

## 2017-05-27 DIAGNOSIS — Z1231 Encounter for screening mammogram for malignant neoplasm of breast: Secondary | ICD-10-CM | POA: Diagnosis not present

## 2017-06-21 DIAGNOSIS — R062 Wheezing: Secondary | ICD-10-CM | POA: Diagnosis not present

## 2017-06-21 DIAGNOSIS — R05 Cough: Secondary | ICD-10-CM | POA: Diagnosis not present

## 2017-06-21 DIAGNOSIS — J40 Bronchitis, not specified as acute or chronic: Secondary | ICD-10-CM | POA: Diagnosis not present

## 2017-06-28 DIAGNOSIS — E782 Mixed hyperlipidemia: Secondary | ICD-10-CM | POA: Diagnosis not present

## 2017-06-28 DIAGNOSIS — E6609 Other obesity due to excess calories: Secondary | ICD-10-CM | POA: Diagnosis not present

## 2017-06-28 DIAGNOSIS — Z1389 Encounter for screening for other disorder: Secondary | ICD-10-CM | POA: Diagnosis not present

## 2017-06-28 DIAGNOSIS — R7309 Other abnormal glucose: Secondary | ICD-10-CM | POA: Diagnosis not present

## 2017-06-28 DIAGNOSIS — F419 Anxiety disorder, unspecified: Secondary | ICD-10-CM | POA: Diagnosis not present

## 2017-06-28 DIAGNOSIS — Z6834 Body mass index (BMI) 34.0-34.9, adult: Secondary | ICD-10-CM | POA: Diagnosis not present

## 2017-06-28 DIAGNOSIS — E559 Vitamin D deficiency, unspecified: Secondary | ICD-10-CM | POA: Diagnosis not present

## 2017-06-28 DIAGNOSIS — I10 Essential (primary) hypertension: Secondary | ICD-10-CM | POA: Diagnosis not present

## 2017-08-26 DIAGNOSIS — H5213 Myopia, bilateral: Secondary | ICD-10-CM | POA: Diagnosis not present

## 2017-08-26 DIAGNOSIS — D485 Neoplasm of uncertain behavior of skin: Secondary | ICD-10-CM | POA: Diagnosis not present

## 2017-08-26 DIAGNOSIS — Z961 Presence of intraocular lens: Secondary | ICD-10-CM | POA: Diagnosis not present

## 2017-10-18 ENCOUNTER — Other Ambulatory Visit: Payer: Self-pay | Admitting: Ophthalmology

## 2017-10-18 DIAGNOSIS — D23121 Other benign neoplasm of skin of left upper eyelid, including canthus: Secondary | ICD-10-CM | POA: Diagnosis not present

## 2017-10-18 DIAGNOSIS — L720 Epidermal cyst: Secondary | ICD-10-CM | POA: Diagnosis not present

## 2017-10-18 DIAGNOSIS — D485 Neoplasm of uncertain behavior of skin: Secondary | ICD-10-CM | POA: Diagnosis not present

## 2017-11-09 DIAGNOSIS — Z6833 Body mass index (BMI) 33.0-33.9, adult: Secondary | ICD-10-CM | POA: Diagnosis not present

## 2017-11-09 DIAGNOSIS — E782 Mixed hyperlipidemia: Secondary | ICD-10-CM | POA: Diagnosis not present

## 2017-11-09 DIAGNOSIS — I10 Essential (primary) hypertension: Secondary | ICD-10-CM | POA: Diagnosis not present

## 2017-11-09 DIAGNOSIS — E6609 Other obesity due to excess calories: Secondary | ICD-10-CM | POA: Diagnosis not present

## 2017-11-09 DIAGNOSIS — R7309 Other abnormal glucose: Secondary | ICD-10-CM | POA: Diagnosis not present

## 2017-11-09 DIAGNOSIS — J069 Acute upper respiratory infection, unspecified: Secondary | ICD-10-CM | POA: Diagnosis not present

## 2017-11-09 DIAGNOSIS — Z1389 Encounter for screening for other disorder: Secondary | ICD-10-CM | POA: Diagnosis not present

## 2017-11-09 DIAGNOSIS — E559 Vitamin D deficiency, unspecified: Secondary | ICD-10-CM | POA: Diagnosis not present

## 2017-12-16 DIAGNOSIS — E782 Mixed hyperlipidemia: Secondary | ICD-10-CM | POA: Diagnosis not present

## 2017-12-16 DIAGNOSIS — I10 Essential (primary) hypertension: Secondary | ICD-10-CM | POA: Diagnosis not present

## 2017-12-16 DIAGNOSIS — E559 Vitamin D deficiency, unspecified: Secondary | ICD-10-CM | POA: Diagnosis not present

## 2017-12-16 DIAGNOSIS — E6609 Other obesity due to excess calories: Secondary | ICD-10-CM | POA: Diagnosis not present

## 2017-12-16 DIAGNOSIS — Z0001 Encounter for general adult medical examination with abnormal findings: Secondary | ICD-10-CM | POA: Diagnosis not present

## 2017-12-16 DIAGNOSIS — Z6832 Body mass index (BMI) 32.0-32.9, adult: Secondary | ICD-10-CM | POA: Diagnosis not present

## 2017-12-16 DIAGNOSIS — Z1389 Encounter for screening for other disorder: Secondary | ICD-10-CM | POA: Diagnosis not present

## 2017-12-16 DIAGNOSIS — R7309 Other abnormal glucose: Secondary | ICD-10-CM | POA: Diagnosis not present

## 2018-01-05 DIAGNOSIS — R21 Rash and other nonspecific skin eruption: Secondary | ICD-10-CM | POA: Diagnosis not present

## 2018-01-26 DIAGNOSIS — Z111 Encounter for screening for respiratory tuberculosis: Secondary | ICD-10-CM | POA: Diagnosis not present

## 2018-03-05 DIAGNOSIS — H6120 Impacted cerumen, unspecified ear: Secondary | ICD-10-CM | POA: Diagnosis not present

## 2018-03-11 DIAGNOSIS — J302 Other seasonal allergic rhinitis: Secondary | ICD-10-CM | POA: Diagnosis not present

## 2018-03-11 DIAGNOSIS — Z6832 Body mass index (BMI) 32.0-32.9, adult: Secondary | ICD-10-CM | POA: Diagnosis not present

## 2018-03-11 DIAGNOSIS — Z23 Encounter for immunization: Secondary | ICD-10-CM | POA: Diagnosis not present

## 2018-03-11 DIAGNOSIS — E6609 Other obesity due to excess calories: Secondary | ICD-10-CM | POA: Diagnosis not present

## 2018-03-11 DIAGNOSIS — H6593 Unspecified nonsuppurative otitis media, bilateral: Secondary | ICD-10-CM | POA: Diagnosis not present

## 2018-03-24 DIAGNOSIS — H6123 Impacted cerumen, bilateral: Secondary | ICD-10-CM | POA: Diagnosis not present

## 2018-03-24 DIAGNOSIS — Z6832 Body mass index (BMI) 32.0-32.9, adult: Secondary | ICD-10-CM | POA: Diagnosis not present

## 2018-03-24 DIAGNOSIS — E6609 Other obesity due to excess calories: Secondary | ICD-10-CM | POA: Diagnosis not present

## 2018-03-24 DIAGNOSIS — Z1389 Encounter for screening for other disorder: Secondary | ICD-10-CM | POA: Diagnosis not present

## 2018-04-12 DIAGNOSIS — H9203 Otalgia, bilateral: Secondary | ICD-10-CM | POA: Diagnosis not present

## 2018-04-12 DIAGNOSIS — E6609 Other obesity due to excess calories: Secondary | ICD-10-CM | POA: Diagnosis not present

## 2018-04-12 DIAGNOSIS — Z6832 Body mass index (BMI) 32.0-32.9, adult: Secondary | ICD-10-CM | POA: Diagnosis not present

## 2018-04-12 DIAGNOSIS — Z1389 Encounter for screening for other disorder: Secondary | ICD-10-CM | POA: Diagnosis not present

## 2018-04-25 ENCOUNTER — Ambulatory Visit (INDEPENDENT_AMBULATORY_CARE_PROVIDER_SITE_OTHER): Payer: Medicare PPO | Admitting: Otolaryngology

## 2018-04-25 DIAGNOSIS — H9222 Otorrhagia, left ear: Secondary | ICD-10-CM

## 2018-04-25 DIAGNOSIS — H903 Sensorineural hearing loss, bilateral: Secondary | ICD-10-CM | POA: Diagnosis not present

## 2018-04-25 DIAGNOSIS — H9312 Tinnitus, left ear: Secondary | ICD-10-CM | POA: Diagnosis not present

## 2018-04-29 ENCOUNTER — Other Ambulatory Visit (INDEPENDENT_AMBULATORY_CARE_PROVIDER_SITE_OTHER): Payer: Self-pay | Admitting: Otolaryngology

## 2018-04-29 DIAGNOSIS — H9042 Sensorineural hearing loss, unilateral, left ear, with unrestricted hearing on the contralateral side: Secondary | ICD-10-CM

## 2018-04-29 DIAGNOSIS — IMO0001 Reserved for inherently not codable concepts without codable children: Secondary | ICD-10-CM

## 2018-05-06 ENCOUNTER — Ambulatory Visit (HOSPITAL_COMMUNITY)
Admission: RE | Admit: 2018-05-06 | Discharge: 2018-05-06 | Disposition: A | Discharge status: Home / Self Care | Payer: Medicare PPO | Source: Ambulatory Visit | Attending: Otolaryngology | Admitting: Otolaryngology

## 2018-05-06 DIAGNOSIS — IMO0001 Reserved for inherently not codable concepts without codable children: Secondary | ICD-10-CM

## 2018-05-06 DIAGNOSIS — H9042 Sensorineural hearing loss, unilateral, left ear, with unrestricted hearing on the contralateral side: Secondary | ICD-10-CM | POA: Diagnosis not present

## 2018-05-06 DIAGNOSIS — H9192 Unspecified hearing loss, left ear: Secondary | ICD-10-CM | POA: Diagnosis not present

## 2018-05-06 LAB — POCT I-STAT CREATININE: Creatinine, Ser: 1.1 mg/dL — ABNORMAL HIGH (ref 0.44–1.00)

## 2018-05-06 MED ORDER — GADOBUTROL 1 MMOL/ML IV SOLN
9.0000 mL | Freq: Once | INTRAVENOUS | Status: AC | PRN
  Administered 2018-05-06: 9 mL via INTRAVENOUS

## 2018-06-09 DIAGNOSIS — Z6832 Body mass index (BMI) 32.0-32.9, adult: Secondary | ICD-10-CM | POA: Diagnosis not present

## 2018-06-09 DIAGNOSIS — E782 Mixed hyperlipidemia: Secondary | ICD-10-CM | POA: Diagnosis not present

## 2018-06-09 DIAGNOSIS — I10 Essential (primary) hypertension: Secondary | ICD-10-CM | POA: Diagnosis not present

## 2018-06-09 DIAGNOSIS — E6609 Other obesity due to excess calories: Secondary | ICD-10-CM | POA: Diagnosis not present

## 2018-06-09 DIAGNOSIS — R7309 Other abnormal glucose: Secondary | ICD-10-CM | POA: Diagnosis not present

## 2018-06-09 DIAGNOSIS — E559 Vitamin D deficiency, unspecified: Secondary | ICD-10-CM | POA: Diagnosis not present

## 2018-07-04 ENCOUNTER — Other Ambulatory Visit (HOSPITAL_COMMUNITY): Payer: Self-pay | Admitting: Family Medicine

## 2018-07-04 DIAGNOSIS — Z1231 Encounter for screening mammogram for malignant neoplasm of breast: Secondary | ICD-10-CM

## 2018-07-13 ENCOUNTER — Ambulatory Visit (HOSPITAL_COMMUNITY)
Admission: RE | Admit: 2018-07-13 | Discharge: 2018-07-13 | Disposition: A | Discharge status: Home / Self Care | Payer: Medicare PPO | Source: Ambulatory Visit | Attending: Family Medicine | Admitting: Family Medicine

## 2018-07-13 ENCOUNTER — Encounter (HOSPITAL_COMMUNITY): Payer: Self-pay

## 2018-07-13 DIAGNOSIS — Z1231 Encounter for screening mammogram for malignant neoplasm of breast: Secondary | ICD-10-CM | POA: Insufficient documentation

## 2018-08-25 ENCOUNTER — Ambulatory Visit (INDEPENDENT_AMBULATORY_CARE_PROVIDER_SITE_OTHER): Payer: Medicare PPO | Admitting: Otolaryngology

## 2018-08-25 DIAGNOSIS — H6123 Impacted cerumen, bilateral: Secondary | ICD-10-CM | POA: Diagnosis not present

## 2018-08-25 DIAGNOSIS — H9312 Tinnitus, left ear: Secondary | ICD-10-CM

## 2018-08-25 DIAGNOSIS — H9313 Tinnitus, bilateral: Secondary | ICD-10-CM | POA: Diagnosis not present

## 2018-08-25 DIAGNOSIS — H9042 Sensorineural hearing loss, unilateral, left ear, with unrestricted hearing on the contralateral side: Secondary | ICD-10-CM | POA: Diagnosis not present

## 2018-10-07 DIAGNOSIS — Z1389 Encounter for screening for other disorder: Secondary | ICD-10-CM | POA: Diagnosis not present

## 2018-10-07 DIAGNOSIS — Z0001 Encounter for general adult medical examination with abnormal findings: Secondary | ICD-10-CM | POA: Diagnosis not present

## 2018-10-07 DIAGNOSIS — Z681 Body mass index (BMI) 19 or less, adult: Secondary | ICD-10-CM | POA: Diagnosis not present

## 2018-10-07 DIAGNOSIS — R7309 Other abnormal glucose: Secondary | ICD-10-CM | POA: Diagnosis not present

## 2018-10-07 DIAGNOSIS — I10 Essential (primary) hypertension: Secondary | ICD-10-CM | POA: Diagnosis not present

## 2018-11-18 DIAGNOSIS — Z1389 Encounter for screening for other disorder: Secondary | ICD-10-CM | POA: Diagnosis not present

## 2018-11-18 DIAGNOSIS — I10 Essential (primary) hypertension: Secondary | ICD-10-CM | POA: Diagnosis not present

## 2018-11-18 DIAGNOSIS — E6609 Other obesity due to excess calories: Secondary | ICD-10-CM | POA: Diagnosis not present

## 2018-11-18 DIAGNOSIS — R7309 Other abnormal glucose: Secondary | ICD-10-CM | POA: Diagnosis not present

## 2018-11-18 DIAGNOSIS — Z6833 Body mass index (BMI) 33.0-33.9, adult: Secondary | ICD-10-CM | POA: Diagnosis not present

## 2018-11-18 DIAGNOSIS — E7849 Other hyperlipidemia: Secondary | ICD-10-CM | POA: Diagnosis not present

## 2018-11-18 DIAGNOSIS — R945 Abnormal results of liver function studies: Secondary | ICD-10-CM | POA: Diagnosis not present

## 2019-01-17 DIAGNOSIS — E6609 Other obesity due to excess calories: Secondary | ICD-10-CM | POA: Diagnosis not present

## 2019-01-17 DIAGNOSIS — R945 Abnormal results of liver function studies: Secondary | ICD-10-CM | POA: Diagnosis not present

## 2019-01-17 DIAGNOSIS — Z6833 Body mass index (BMI) 33.0-33.9, adult: Secondary | ICD-10-CM | POA: Diagnosis not present

## 2019-01-17 DIAGNOSIS — E7849 Other hyperlipidemia: Secondary | ICD-10-CM | POA: Diagnosis not present

## 2019-03-10 DIAGNOSIS — M545 Low back pain: Secondary | ICD-10-CM | POA: Diagnosis not present

## 2019-03-10 DIAGNOSIS — R7309 Other abnormal glucose: Secondary | ICD-10-CM | POA: Diagnosis not present

## 2019-03-10 DIAGNOSIS — M5410 Radiculopathy, site unspecified: Secondary | ICD-10-CM | POA: Diagnosis not present

## 2019-03-10 DIAGNOSIS — Z6834 Body mass index (BMI) 34.0-34.9, adult: Secondary | ICD-10-CM | POA: Diagnosis not present

## 2019-03-10 DIAGNOSIS — E6609 Other obesity due to excess calories: Secondary | ICD-10-CM | POA: Diagnosis not present

## 2019-03-24 DIAGNOSIS — E6609 Other obesity due to excess calories: Secondary | ICD-10-CM | POA: Diagnosis not present

## 2019-03-24 DIAGNOSIS — L821 Other seborrheic keratosis: Secondary | ICD-10-CM | POA: Diagnosis not present

## 2019-03-24 DIAGNOSIS — Z6834 Body mass index (BMI) 34.0-34.9, adult: Secondary | ICD-10-CM | POA: Diagnosis not present

## 2019-03-24 DIAGNOSIS — D492 Neoplasm of unspecified behavior of bone, soft tissue, and skin: Secondary | ICD-10-CM | POA: Diagnosis not present

## 2019-04-10 DIAGNOSIS — Z961 Presence of intraocular lens: Secondary | ICD-10-CM | POA: Diagnosis not present

## 2019-04-10 DIAGNOSIS — H524 Presbyopia: Secondary | ICD-10-CM | POA: Diagnosis not present

## 2019-04-10 DIAGNOSIS — H5213 Myopia, bilateral: Secondary | ICD-10-CM | POA: Diagnosis not present

## 2019-05-26 DIAGNOSIS — M5410 Radiculopathy, site unspecified: Secondary | ICD-10-CM | POA: Diagnosis not present

## 2019-05-26 DIAGNOSIS — M5136 Other intervertebral disc degeneration, lumbar region: Secondary | ICD-10-CM | POA: Diagnosis not present

## 2019-05-26 DIAGNOSIS — E6609 Other obesity due to excess calories: Secondary | ICD-10-CM | POA: Diagnosis not present

## 2019-05-26 DIAGNOSIS — Z6834 Body mass index (BMI) 34.0-34.9, adult: Secondary | ICD-10-CM | POA: Diagnosis not present

## 2019-06-28 DIAGNOSIS — M5117 Intervertebral disc disorders with radiculopathy, lumbosacral region: Secondary | ICD-10-CM | POA: Diagnosis not present

## 2019-06-28 DIAGNOSIS — M4316 Spondylolisthesis, lumbar region: Secondary | ICD-10-CM | POA: Diagnosis not present

## 2019-06-28 DIAGNOSIS — M4726 Other spondylosis with radiculopathy, lumbar region: Secondary | ICD-10-CM | POA: Diagnosis not present

## 2019-06-28 DIAGNOSIS — N289 Disorder of kidney and ureter, unspecified: Secondary | ICD-10-CM | POA: Diagnosis not present

## 2019-06-28 DIAGNOSIS — R937 Abnormal findings on diagnostic imaging of other parts of musculoskeletal system: Secondary | ICD-10-CM | POA: Diagnosis not present

## 2019-06-28 DIAGNOSIS — M5116 Intervertebral disc disorders with radiculopathy, lumbar region: Secondary | ICD-10-CM | POA: Diagnosis not present

## 2019-07-06 DIAGNOSIS — M4316 Spondylolisthesis, lumbar region: Secondary | ICD-10-CM | POA: Diagnosis not present

## 2019-07-06 DIAGNOSIS — M4310 Spondylolisthesis, site unspecified: Secondary | ICD-10-CM | POA: Insufficient documentation

## 2019-07-06 DIAGNOSIS — M47816 Spondylosis without myelopathy or radiculopathy, lumbar region: Secondary | ICD-10-CM | POA: Diagnosis not present

## 2019-07-06 DIAGNOSIS — Z6832 Body mass index (BMI) 32.0-32.9, adult: Secondary | ICD-10-CM | POA: Diagnosis not present

## 2019-07-06 DIAGNOSIS — M545 Low back pain: Secondary | ICD-10-CM | POA: Diagnosis not present

## 2019-07-06 DIAGNOSIS — I10 Essential (primary) hypertension: Secondary | ICD-10-CM | POA: Diagnosis not present

## 2019-07-20 DIAGNOSIS — M47816 Spondylosis without myelopathy or radiculopathy, lumbar region: Secondary | ICD-10-CM | POA: Diagnosis not present

## 2019-08-08 DIAGNOSIS — M47816 Spondylosis without myelopathy or radiculopathy, lumbar region: Secondary | ICD-10-CM | POA: Diagnosis not present

## 2019-08-17 DIAGNOSIS — E559 Vitamin D deficiency, unspecified: Secondary | ICD-10-CM | POA: Diagnosis not present

## 2019-08-17 DIAGNOSIS — R945 Abnormal results of liver function studies: Secondary | ICD-10-CM | POA: Diagnosis not present

## 2019-08-17 DIAGNOSIS — E6609 Other obesity due to excess calories: Secondary | ICD-10-CM | POA: Diagnosis not present

## 2019-08-17 DIAGNOSIS — I10 Essential (primary) hypertension: Secondary | ICD-10-CM | POA: Diagnosis not present

## 2019-08-17 DIAGNOSIS — Z6834 Body mass index (BMI) 34.0-34.9, adult: Secondary | ICD-10-CM | POA: Diagnosis not present

## 2019-08-17 DIAGNOSIS — E7849 Other hyperlipidemia: Secondary | ICD-10-CM | POA: Diagnosis not present

## 2019-08-17 DIAGNOSIS — Z0000 Encounter for general adult medical examination without abnormal findings: Secondary | ICD-10-CM | POA: Diagnosis not present

## 2019-08-17 DIAGNOSIS — R7309 Other abnormal glucose: Secondary | ICD-10-CM | POA: Diagnosis not present

## 2019-08-17 DIAGNOSIS — Z1389 Encounter for screening for other disorder: Secondary | ICD-10-CM | POA: Diagnosis not present

## 2019-08-17 DIAGNOSIS — F419 Anxiety disorder, unspecified: Secondary | ICD-10-CM | POA: Diagnosis not present

## 2019-08-30 DIAGNOSIS — Z6831 Body mass index (BMI) 31.0-31.9, adult: Secondary | ICD-10-CM | POA: Diagnosis not present

## 2019-08-30 DIAGNOSIS — I10 Essential (primary) hypertension: Secondary | ICD-10-CM | POA: Diagnosis not present

## 2019-08-30 DIAGNOSIS — M4316 Spondylolisthesis, lumbar region: Secondary | ICD-10-CM | POA: Diagnosis not present

## 2019-10-06 DIAGNOSIS — M47816 Spondylosis without myelopathy or radiculopathy, lumbar region: Secondary | ICD-10-CM | POA: Diagnosis not present

## 2019-10-10 ENCOUNTER — Other Ambulatory Visit (HOSPITAL_COMMUNITY): Payer: Self-pay | Admitting: Family Medicine

## 2019-10-10 DIAGNOSIS — Z1231 Encounter for screening mammogram for malignant neoplasm of breast: Secondary | ICD-10-CM

## 2019-10-18 ENCOUNTER — Ambulatory Visit (HOSPITAL_COMMUNITY)
Admission: RE | Admit: 2019-10-18 | Discharge: 2019-10-18 | Disposition: A | Discharge status: Home / Self Care | Payer: Medicare PPO | Source: Ambulatory Visit | Attending: Family Medicine | Admitting: Family Medicine

## 2019-10-18 ENCOUNTER — Other Ambulatory Visit (HOSPITAL_COMMUNITY): Payer: Self-pay | Admitting: Family Medicine

## 2019-10-18 ENCOUNTER — Other Ambulatory Visit: Payer: Self-pay

## 2019-10-18 DIAGNOSIS — R928 Other abnormal and inconclusive findings on diagnostic imaging of breast: Secondary | ICD-10-CM

## 2019-10-18 DIAGNOSIS — Z1231 Encounter for screening mammogram for malignant neoplasm of breast: Secondary | ICD-10-CM | POA: Insufficient documentation

## 2019-10-23 DIAGNOSIS — E6609 Other obesity due to excess calories: Secondary | ICD-10-CM | POA: Diagnosis not present

## 2019-10-23 DIAGNOSIS — E7849 Other hyperlipidemia: Secondary | ICD-10-CM | POA: Diagnosis not present

## 2019-10-23 DIAGNOSIS — Z6834 Body mass index (BMI) 34.0-34.9, adult: Secondary | ICD-10-CM | POA: Diagnosis not present

## 2019-11-07 ENCOUNTER — Ambulatory Visit (HOSPITAL_COMMUNITY)
Admission: RE | Admit: 2019-11-07 | Discharge: 2019-11-07 | Disposition: A | Discharge status: Home / Self Care | Payer: Medicare PPO | Source: Ambulatory Visit | Attending: Family Medicine | Admitting: Family Medicine

## 2019-11-07 ENCOUNTER — Other Ambulatory Visit: Payer: Self-pay

## 2019-11-07 DIAGNOSIS — R921 Mammographic calcification found on diagnostic imaging of breast: Secondary | ICD-10-CM | POA: Diagnosis not present

## 2019-11-07 DIAGNOSIS — R928 Other abnormal and inconclusive findings on diagnostic imaging of breast: Secondary | ICD-10-CM | POA: Diagnosis not present

## 2019-11-16 DIAGNOSIS — E6609 Other obesity due to excess calories: Secondary | ICD-10-CM | POA: Diagnosis not present

## 2019-11-16 DIAGNOSIS — R928 Other abnormal and inconclusive findings on diagnostic imaging of breast: Secondary | ICD-10-CM | POA: Diagnosis not present

## 2019-11-16 DIAGNOSIS — Z6835 Body mass index (BMI) 35.0-35.9, adult: Secondary | ICD-10-CM | POA: Diagnosis not present

## 2019-11-17 ENCOUNTER — Other Ambulatory Visit: Payer: Self-pay | Admitting: Family Medicine

## 2019-11-17 DIAGNOSIS — R921 Mammographic calcification found on diagnostic imaging of breast: Secondary | ICD-10-CM

## 2019-11-29 ENCOUNTER — Other Ambulatory Visit: Payer: Self-pay

## 2019-11-29 ENCOUNTER — Other Ambulatory Visit (HOSPITAL_COMMUNITY): Payer: Self-pay | Admitting: Diagnostic Radiology

## 2019-11-29 ENCOUNTER — Ambulatory Visit
Admission: RE | Admit: 2019-11-29 | Discharge: 2019-11-29 | Disposition: A | Discharge status: Home / Self Care | Payer: Medicare PPO | Source: Ambulatory Visit | Attending: Family Medicine | Admitting: Family Medicine

## 2019-11-29 ENCOUNTER — Other Ambulatory Visit: Payer: Self-pay | Admitting: Family Medicine

## 2019-11-29 DIAGNOSIS — R921 Mammographic calcification found on diagnostic imaging of breast: Secondary | ICD-10-CM

## 2019-11-29 DIAGNOSIS — N6489 Other specified disorders of breast: Secondary | ICD-10-CM | POA: Diagnosis not present

## 2020-02-09 DIAGNOSIS — Z6834 Body mass index (BMI) 34.0-34.9, adult: Secondary | ICD-10-CM | POA: Diagnosis not present

## 2020-02-09 DIAGNOSIS — R7309 Other abnormal glucose: Secondary | ICD-10-CM | POA: Diagnosis not present

## 2020-02-09 DIAGNOSIS — E7849 Other hyperlipidemia: Secondary | ICD-10-CM | POA: Diagnosis not present

## 2020-02-09 DIAGNOSIS — R945 Abnormal results of liver function studies: Secondary | ICD-10-CM | POA: Diagnosis not present

## 2020-02-09 DIAGNOSIS — E559 Vitamin D deficiency, unspecified: Secondary | ICD-10-CM | POA: Diagnosis not present

## 2020-02-09 DIAGNOSIS — I10 Essential (primary) hypertension: Secondary | ICD-10-CM | POA: Diagnosis not present

## 2020-04-11 DIAGNOSIS — Z6834 Body mass index (BMI) 34.0-34.9, adult: Secondary | ICD-10-CM | POA: Diagnosis not present

## 2020-04-11 DIAGNOSIS — M5030 Other cervical disc degeneration, unspecified cervical region: Secondary | ICD-10-CM | POA: Diagnosis not present

## 2020-04-11 DIAGNOSIS — E6609 Other obesity due to excess calories: Secondary | ICD-10-CM | POA: Diagnosis not present

## 2020-04-11 DIAGNOSIS — M542 Cervicalgia: Secondary | ICD-10-CM | POA: Diagnosis not present

## 2020-04-12 DIAGNOSIS — Z961 Presence of intraocular lens: Secondary | ICD-10-CM | POA: Diagnosis not present

## 2020-04-12 DIAGNOSIS — H5213 Myopia, bilateral: Secondary | ICD-10-CM | POA: Diagnosis not present

## 2020-04-29 DIAGNOSIS — M4316 Spondylolisthesis, lumbar region: Secondary | ICD-10-CM | POA: Diagnosis not present

## 2020-05-23 DIAGNOSIS — Z6834 Body mass index (BMI) 34.0-34.9, adult: Secondary | ICD-10-CM | POA: Diagnosis not present

## 2020-05-23 DIAGNOSIS — E6609 Other obesity due to excess calories: Secondary | ICD-10-CM | POA: Diagnosis not present

## 2020-05-23 DIAGNOSIS — R000 Tachycardia, unspecified: Secondary | ICD-10-CM | POA: Diagnosis not present

## 2020-05-23 DIAGNOSIS — F430 Acute stress reaction: Secondary | ICD-10-CM | POA: Diagnosis not present

## 2020-05-30 DIAGNOSIS — M47816 Spondylosis without myelopathy or radiculopathy, lumbar region: Secondary | ICD-10-CM | POA: Diagnosis not present

## 2020-05-30 DIAGNOSIS — M461 Sacroiliitis, not elsewhere classified: Secondary | ICD-10-CM | POA: Diagnosis not present

## 2020-06-12 DIAGNOSIS — M461 Sacroiliitis, not elsewhere classified: Secondary | ICD-10-CM | POA: Diagnosis not present

## 2020-08-15 DIAGNOSIS — E6609 Other obesity due to excess calories: Secondary | ICD-10-CM | POA: Diagnosis not present

## 2020-08-15 DIAGNOSIS — Z6833 Body mass index (BMI) 33.0-33.9, adult: Secondary | ICD-10-CM | POA: Diagnosis not present

## 2020-08-15 DIAGNOSIS — E559 Vitamin D deficiency, unspecified: Secondary | ICD-10-CM | POA: Diagnosis not present

## 2020-08-15 DIAGNOSIS — E7849 Other hyperlipidemia: Secondary | ICD-10-CM | POA: Diagnosis not present

## 2020-08-15 DIAGNOSIS — R945 Abnormal results of liver function studies: Secondary | ICD-10-CM | POA: Diagnosis not present

## 2020-08-15 DIAGNOSIS — I10 Essential (primary) hypertension: Secondary | ICD-10-CM | POA: Diagnosis not present

## 2020-08-15 DIAGNOSIS — R7309 Other abnormal glucose: Secondary | ICD-10-CM | POA: Diagnosis not present

## 2020-08-15 DIAGNOSIS — Z1389 Encounter for screening for other disorder: Secondary | ICD-10-CM | POA: Diagnosis not present

## 2020-08-15 DIAGNOSIS — Z0001 Encounter for general adult medical examination with abnormal findings: Secondary | ICD-10-CM | POA: Diagnosis not present

## 2020-10-07 ENCOUNTER — Other Ambulatory Visit: Payer: Self-pay | Admitting: Family Medicine

## 2020-10-07 DIAGNOSIS — Z1231 Encounter for screening mammogram for malignant neoplasm of breast: Secondary | ICD-10-CM

## 2020-10-17 DIAGNOSIS — G609 Hereditary and idiopathic neuropathy, unspecified: Secondary | ICD-10-CM | POA: Diagnosis not present

## 2020-10-17 DIAGNOSIS — L03032 Cellulitis of left toe: Secondary | ICD-10-CM | POA: Diagnosis not present

## 2020-10-30 DIAGNOSIS — Z6833 Body mass index (BMI) 33.0-33.9, adult: Secondary | ICD-10-CM | POA: Diagnosis not present

## 2020-10-30 DIAGNOSIS — G2581 Restless legs syndrome: Secondary | ICD-10-CM | POA: Diagnosis not present

## 2020-10-30 DIAGNOSIS — E559 Vitamin D deficiency, unspecified: Secondary | ICD-10-CM | POA: Diagnosis not present

## 2020-10-30 DIAGNOSIS — G609 Hereditary and idiopathic neuropathy, unspecified: Secondary | ICD-10-CM | POA: Diagnosis not present

## 2020-10-30 DIAGNOSIS — T461X5A Adverse effect of calcium-channel blockers, initial encounter: Secondary | ICD-10-CM | POA: Diagnosis not present

## 2020-10-30 DIAGNOSIS — E6609 Other obesity due to excess calories: Secondary | ICD-10-CM | POA: Diagnosis not present

## 2020-10-31 DIAGNOSIS — G609 Hereditary and idiopathic neuropathy, unspecified: Secondary | ICD-10-CM | POA: Diagnosis not present

## 2020-10-31 DIAGNOSIS — L03032 Cellulitis of left toe: Secondary | ICD-10-CM | POA: Diagnosis not present

## 2020-11-27 ENCOUNTER — Other Ambulatory Visit: Payer: Self-pay

## 2020-11-27 ENCOUNTER — Ambulatory Visit
Admission: RE | Admit: 2020-11-27 | Discharge: 2020-11-27 | Disposition: A | Discharge status: Home / Self Care | Payer: Medicare PPO | Source: Ambulatory Visit | Attending: Family Medicine | Admitting: Family Medicine

## 2020-11-27 DIAGNOSIS — Z1231 Encounter for screening mammogram for malignant neoplasm of breast: Secondary | ICD-10-CM

## 2020-12-04 DIAGNOSIS — D23111 Other benign neoplasm of skin of right upper eyelid, including canthus: Secondary | ICD-10-CM | POA: Diagnosis not present

## 2020-12-04 DIAGNOSIS — D23121 Other benign neoplasm of skin of left upper eyelid, including canthus: Secondary | ICD-10-CM | POA: Diagnosis not present

## 2020-12-08 ENCOUNTER — Ambulatory Visit
Admission: EM | Admit: 2020-12-08 | Discharge: 2020-12-08 | Disposition: A | Discharge status: Home / Self Care | Payer: Medicare PPO

## 2020-12-08 ENCOUNTER — Other Ambulatory Visit: Payer: Self-pay

## 2020-12-08 DIAGNOSIS — J0110 Acute frontal sinusitis, unspecified: Secondary | ICD-10-CM

## 2020-12-08 DIAGNOSIS — J302 Other seasonal allergic rhinitis: Secondary | ICD-10-CM

## 2020-12-08 MED ORDER — FLUTICASONE PROPIONATE 50 MCG/ACT NA SUSP: 2.0000 | Freq: Every day | NASAL | 2 refills | Status: AC

## 2020-12-08 MED ORDER — AMOXICILLIN-POT CLAVULANATE 875-125 MG PO TABS: 1.0000 | ORAL_TABLET | Freq: Two times a day (BID) | ORAL | 0 refills | Status: AC

## 2020-12-08 MED ORDER — BENZONATATE 100 MG PO CAPS: 100.0000 mg | ORAL_CAPSULE | Freq: Three times a day (TID) | ORAL | 0 refills | Status: AC

## 2020-12-08 NOTE — ED Triage Notes (Signed)
Pt c/o sinus congestion since Thursday, also headache. Pt reports trouble sleeping due to the congestion. Pt reports she gets sinus congestion about this time every year. Pt denies f/n/v/d or other symptoms.

## 2020-12-08 NOTE — Discharge Instructions (Addendum)
-  Take medications as prescribed. -Begin using Zyrtec daily.  Take 1 of Benadryl prior to sleeping to see if this helps with your nighttime symptoms. -If no improvement by the end of the week prescription for Augmentin has been sent into your pharmacy to begin at this time. -Follow-up if no improvement.

## 2020-12-08 NOTE — ED Provider Notes (Signed)
[facility]    CSN: [id number] Arrival date & time: 12/08/20  7741      History   Chief Complaint Chief Complaint  Patient presents with   Nasal Congestion   HPI Samantha Oliver is a 75 y.o. female who presents today for evaluation of facial pressure, sinus drainage and scratchy throat ongoing for the last 3-4 days.  The patient denies any sick contacts.  Denies any COVID exposures.  She is up-to-date on her vaccines.  The patient denies any fevers or chills at home.  She reports a stuffy nose especially at night.  She does report some cough with productive phlegm.  She states that when she blows her nose the discharge is clear.  She denies using any over-the-counter medications at home, she has attempted to use Mucinex.  She denies any shortness of breath, chest pain.  Denies any headaches, loss of vision or loss of hearing.  HPI  Past Medical History:  Diagnosis Date   Arthritis    Cataract immature    Hypercholesteremia    Hypertension     Patient Active Problem List   Diagnosis Date Noted   HIP PAIN, RIGHT 08/20/2010   DEGENERATIVE DISC DISEASE, LUMBOSACRAL SPINE W/RADICULOPATHY 08/20/2010    Past Surgical History:  Procedure Laterality Date   COLONOSCOPY  09/08/2011   Procedure: COLONOSCOPY;  Surgeon: Dalia Heading, MD;  Location: [facility];  Service: Gastroenterology;  Laterality: N/A;   OOPHORECTOMY      OB History   No obstetric history on file.      Home Medications    Prior to Admission medications   Medication Sig Start Date End Date Taking? Authorizing Provider  amLODipine (NORVASC) 10 MG tablet Take 1 tablet by mouth daily. 09/11/20  Yes [provider]  amoxicillin-clavulanate (AUGMENTIN) 875-125 MG tablet Take 1 tablet by mouth every 12 (twelve) hours for 10 days. 12/08/20 12/18/20 Yes Anson Oregon, PA-C  benzonatate (TESSALON) 100 MG capsule Take 1 capsule (100 mg total) by mouth every 8 (eight) hours. 12/08/20  Yes  Anson Oregon, PA-C  fluticasone [facility]) 50 MCG/ACT nasal spray Place 2 sprays into both nostrils daily. 12/08/20  Yes Anson Oregon, PA-C  rosuvastatin (CRESTOR) 10 MG tablet Take 10 mg by mouth daily. 09/11/20  Yes [provider]  meloxicam (MOBIC) 15 MG tablet Take 15 mg by mouth daily.    [provider]  Multiple Vitamin (MULTIVITAMIN WITH MINERALS) TABS Take 1 tablet by mouth daily.    [provider]  olmesartan (BENICAR) 40 MG tablet Take 40 mg by mouth daily. 10/30/20   [provider]  rOPINIRole (REQUIP) 0.25 MG tablet SMARTSIG:1-2 Tablet(s) By Mouth Every Evening 10/30/20   [provider]  gabapentin (NEURONTIN) 100 MG capsule Take 100 mg by mouth 3 (three) times daily.    09/02/11  [provider]    Family History Family History  Problem Relation Age of Onset   Heart attack Father     Social History Social History   Tobacco Use   Smoking status: Former    Pack years: 0.00   Smokeless tobacco: Never  Vaping Use   Vaping Use: Never used  Substance Use Topics   Alcohol use: No   Drug use: No     Allergies   Aspirin   Review of Systems Review of Systems  HENT:  Positive for congestion, rhinorrhea and sinus pain.   Eyes: Negative.   Respiratory:  Positive for  cough. Negative for shortness of breath, wheezing and stridor.   Cardiovascular: Negative.   Gastrointestinal:  Negative for nausea and vomiting.  Endocrine: Negative.   Genitourinary: Negative.   Musculoskeletal: Negative.   Skin: Negative.   Allergic/Immunologic: Negative.   Neurological: Negative.   Hematological: Negative.     Physical Exam Triage Vital Signs ED Triage Vitals  Enc Vitals Group     BP 12/08/20 1022 (!) 151/75     Pulse Rate 12/08/20 1022 69     Resp 12/08/20 1022 18     Temp 12/08/20 1022 98.6 F (37 C)     Temp Source 12/08/20 1022 Oral     SpO2 12/08/20 1022 98 %     Weight 12/08/20 1019 216 lb (98 kg)      Height 12/08/20 1019 5\' 10"  (1.778 m)     Head Circumference --      Peak Flow --      Pain Score 12/08/20 1019 7     Pain Loc --      Pain Edu? --      Excl. in GC? --    No data found.  Updated Vital Signs BP (!) 151/75 (BP Location: Left Arm)   Pulse 69   Temp 98.6 F (37 C) (Oral)   Resp 18   Ht 5\' 10"  (1.778 m)   Wt 216 lb (98 kg)   SpO2 98%   BMI 30.99 kg/m   Visual Acuity Right Eye Distance:   Left Eye Distance:   Bilateral Distance:    Right Eye Near:   Left Eye Near:    Bilateral Near:     Physical Exam Constitutional:      General: She is not in acute distress.    Appearance: Normal appearance. She is well-developed and well-groomed. She is not ill-appearing or toxic-appearing.  HENT:     Head: Normocephalic.     Ears:     Comments: Mild bulging to bilateral tympanic membranes. No erythema to either ear canal.    Nose:     Comments: Mild erythema to bilateral nasal canals.  Rhinorrhea present.    Mouth/Throat:     Comments: At most minimal erythema to the posterior oropharynx.  No purulent drainage or exudate. Cardiovascular:     Rate and Rhythm: Normal rate and regular rhythm.     Heart sounds: Normal heart sounds, S1 normal and S2 normal.  Pulmonary:     Effort: Pulmonary effort is normal.     Breath sounds: Normal breath sounds. No wheezing, rhonchi or rales.  Abdominal:     General: Abdomen is flat. There is no distension.     Tenderness: There is no abdominal tenderness.  Psychiatric:        Behavior: Behavior is cooperative.     UC Treatments / Results  Labs (all labs ordered are listed, but only abnormal results are displayed) Labs Reviewed - No data to display  EKG   Radiology No results found.  Procedures Procedures (including critical care time)  Medications Ordered in UC Medications - No data to display  Initial Impression / Assessment and Plan / UC Course  I have reviewed the triage vital signs and the nursing  notes.  Pertinent labs & imaging results that were available during my care of the patient were reviewed by me and considered in my medical decision making (see chart for details).     1.  Treatment options were discussed today with the patient. 2.  I  believe that her symptoms at today's visit are likely due to allergies at this time. 3.  The patient was instructed to use Zyrtec daily.  Benadryl as needed at night.  The patient was given a prescription for Flonase in addition to [facility] for cough symptoms. 4.  She was instructed that if by the end of the week she has not noticed any relief of her symptoms, she was given a prescription of Augmentin to begin at this time. 5.  Follow-up on as-needed basis at this time. Final Clinical Impressions(s) / UC Diagnoses   Final diagnoses:  Seasonal allergic rhinitis, unspecified trigger  Acute non-recurrent frontal sinusitis     Discharge Instructions      -Take medications as prescribed. -Begin using Zyrtec daily.  Take 1 of Benadryl prior to sleeping to see if this helps with your nighttime symptoms. -If no improvement by the end of the week prescription for Augmentin has been sent into your pharmacy to begin at this time. -Follow-up if no improvement.   ED Prescriptions     Medication Sig Dispense Auth. Provider   fluticasone (FLONASE) 50 MCG/ACT nasal spray Place 2 sprays into both nostrils daily. 16 g Anson Oregon, PA-C   benzonatate (TESSALON) 100 MG capsule Take 1 capsule (100 mg total) by mouth every 8 (eight) hours. 21 capsule Anson Oregon, PA-C   amoxicillin-clavulanate (AUGMENTIN) 875-125 MG tablet Take 1 tablet by mouth every 12 (twelve) hours for 10 days. 20 tablet Anson Oregon, PA-C      PDMP not reviewed this encounter.   Anson Oregon, PA-C 12/08/20 1144

## 2021-01-29 DIAGNOSIS — M461 Sacroiliitis, not elsewhere classified: Secondary | ICD-10-CM | POA: Diagnosis not present

## 2021-02-06 DIAGNOSIS — E6609 Other obesity due to excess calories: Secondary | ICD-10-CM | POA: Diagnosis not present

## 2021-02-06 DIAGNOSIS — Z6833 Body mass index (BMI) 33.0-33.9, adult: Secondary | ICD-10-CM | POA: Diagnosis not present

## 2021-02-06 DIAGNOSIS — E782 Mixed hyperlipidemia: Secondary | ICD-10-CM | POA: Diagnosis not present

## 2021-02-06 DIAGNOSIS — E559 Vitamin D deficiency, unspecified: Secondary | ICD-10-CM | POA: Diagnosis not present

## 2021-02-06 DIAGNOSIS — R945 Abnormal results of liver function studies: Secondary | ICD-10-CM | POA: Diagnosis not present

## 2021-02-06 DIAGNOSIS — F419 Anxiety disorder, unspecified: Secondary | ICD-10-CM | POA: Diagnosis not present

## 2021-02-06 DIAGNOSIS — E7849 Other hyperlipidemia: Secondary | ICD-10-CM | POA: Diagnosis not present

## 2021-02-06 DIAGNOSIS — J329 Chronic sinusitis, unspecified: Secondary | ICD-10-CM | POA: Diagnosis not present

## 2021-02-06 DIAGNOSIS — I10 Essential (primary) hypertension: Secondary | ICD-10-CM | POA: Diagnosis not present

## 2021-02-06 DIAGNOSIS — R7309 Other abnormal glucose: Secondary | ICD-10-CM | POA: Diagnosis not present

## 2021-05-28 DIAGNOSIS — E7849 Other hyperlipidemia: Secondary | ICD-10-CM | POA: Diagnosis not present

## 2021-05-28 DIAGNOSIS — G2581 Restless legs syndrome: Secondary | ICD-10-CM | POA: Diagnosis not present

## 2021-05-28 DIAGNOSIS — E559 Vitamin D deficiency, unspecified: Secondary | ICD-10-CM | POA: Diagnosis not present

## 2021-05-28 DIAGNOSIS — J302 Other seasonal allergic rhinitis: Secondary | ICD-10-CM | POA: Diagnosis not present

## 2021-05-28 DIAGNOSIS — E6609 Other obesity due to excess calories: Secondary | ICD-10-CM | POA: Diagnosis not present

## 2021-05-28 DIAGNOSIS — R7309 Other abnormal glucose: Secondary | ICD-10-CM | POA: Diagnosis not present

## 2021-05-28 DIAGNOSIS — F419 Anxiety disorder, unspecified: Secondary | ICD-10-CM | POA: Diagnosis not present

## 2021-05-28 DIAGNOSIS — I10 Essential (primary) hypertension: Secondary | ICD-10-CM | POA: Diagnosis not present

## 2021-05-28 DIAGNOSIS — M1712 Unilateral primary osteoarthritis, left knee: Secondary | ICD-10-CM | POA: Diagnosis not present

## 2021-05-28 DIAGNOSIS — E782 Mixed hyperlipidemia: Secondary | ICD-10-CM | POA: Diagnosis not present

## 2021-08-04 ENCOUNTER — Other Ambulatory Visit (HOSPITAL_COMMUNITY): Payer: Self-pay | Admitting: Family Medicine

## 2021-08-04 DIAGNOSIS — R7309 Other abnormal glucose: Secondary | ICD-10-CM | POA: Diagnosis not present

## 2021-08-04 DIAGNOSIS — Z0001 Encounter for general adult medical examination with abnormal findings: Secondary | ICD-10-CM | POA: Diagnosis not present

## 2021-08-04 DIAGNOSIS — E559 Vitamin D deficiency, unspecified: Secondary | ICD-10-CM | POA: Diagnosis not present

## 2021-08-04 DIAGNOSIS — G2581 Restless legs syndrome: Secondary | ICD-10-CM | POA: Diagnosis not present

## 2021-08-04 DIAGNOSIS — E7849 Other hyperlipidemia: Secondary | ICD-10-CM | POA: Diagnosis not present

## 2021-08-04 DIAGNOSIS — Z1331 Encounter for screening for depression: Secondary | ICD-10-CM | POA: Diagnosis not present

## 2021-08-04 DIAGNOSIS — I10 Essential (primary) hypertension: Secondary | ICD-10-CM | POA: Diagnosis not present

## 2021-08-04 DIAGNOSIS — M1712 Unilateral primary osteoarthritis, left knee: Secondary | ICD-10-CM | POA: Diagnosis not present

## 2021-08-04 DIAGNOSIS — E782 Mixed hyperlipidemia: Secondary | ICD-10-CM | POA: Diagnosis not present

## 2021-08-04 DIAGNOSIS — Z6832 Body mass index (BMI) 32.0-32.9, adult: Secondary | ICD-10-CM | POA: Diagnosis not present

## 2021-08-04 DIAGNOSIS — E2839 Other primary ovarian failure: Secondary | ICD-10-CM

## 2021-08-11 ENCOUNTER — Other Ambulatory Visit: Payer: Self-pay

## 2021-08-11 ENCOUNTER — Ambulatory Visit (HOSPITAL_COMMUNITY)
Admission: RE | Admit: 2021-08-11 | Discharge: 2021-08-11 | Disposition: A | Discharge status: Home / Self Care | Payer: Medicare PPO | Source: Ambulatory Visit | Attending: Family Medicine | Admitting: Family Medicine

## 2021-08-11 DIAGNOSIS — M8589 Other specified disorders of bone density and structure, multiple sites: Secondary | ICD-10-CM | POA: Diagnosis not present

## 2021-08-11 DIAGNOSIS — E2839 Other primary ovarian failure: Secondary | ICD-10-CM | POA: Insufficient documentation

## 2021-08-11 DIAGNOSIS — Z780 Asymptomatic menopausal state: Secondary | ICD-10-CM | POA: Diagnosis not present

## 2021-08-25 DIAGNOSIS — Z024 Encounter for examination for driving license: Secondary | ICD-10-CM | POA: Diagnosis not present

## 2021-08-25 DIAGNOSIS — E6609 Other obesity due to excess calories: Secondary | ICD-10-CM | POA: Diagnosis not present

## 2021-08-25 DIAGNOSIS — Z6833 Body mass index (BMI) 33.0-33.9, adult: Secondary | ICD-10-CM | POA: Diagnosis not present

## 2021-09-13 ENCOUNTER — Other Ambulatory Visit: Payer: Self-pay

## 2021-09-13 ENCOUNTER — Ambulatory Visit
Admission: EM | Admit: 2021-09-13 | Discharge: 2021-09-13 | Disposition: A | Discharge status: Home / Self Care | Payer: Medicare PPO | Attending: Emergency Medicine | Admitting: Emergency Medicine

## 2021-09-13 DIAGNOSIS — R0982 Postnasal drip: Secondary | ICD-10-CM

## 2021-09-13 DIAGNOSIS — J309 Allergic rhinitis, unspecified: Secondary | ICD-10-CM | POA: Diagnosis not present

## 2021-09-13 MED ORDER — PREDNISONE 10 MG (21) PO TBPK: ORAL_TABLET | Freq: Every day | ORAL | 0 refills | Status: DC

## 2021-09-13 NOTE — ED Provider Notes (Signed)
? ?[facility] ?Provider Note ? ?Patient Contact: 1:18 PM (approximate) ? ? ?History  ? ?Nasal Congestion and Cough ? ? ?HPI ? ?Samantha Oliver is a 76 y.o. female who presents the emergency department with congestion, postnasal drip, cough.  Patient states that she has significant allergies, is already on Allegra and a generic Flonase.  Patient states that the drainage and amount of congestion with her seasonal allergies is unmanageable at this time. ?  ? ? ?Physical Exam  ? ?Triage Vital Signs: ?ED Triage Vitals  ?Enc Vitals Group  ?   BP 09/13/21 1246 (!) 140/58  ?   Pulse Rate 09/13/21 1246 73  ?   Resp 09/13/21 1246 18  ?   Temp 09/13/21 1246 98.8 ?F (37.1 ?C)  ?   Temp Source 09/13/21 1246 Oral  ?   SpO2 09/13/21 1246 97 %  ?   Weight --   ?   Height --   ?   Head Circumference --   ?   Peak Flow --   ?   Pain Score 09/13/21 1247 0  ?   Pain Loc --   ?   Pain Edu? --   ?   Excl. in GC? --   ? ? ?Most recent vital signs: ?Vitals:  ? 09/13/21 1246  ?BP: (!) 140/58  ?Pulse: 73  ?Resp: 18  ?Temp: 98.8 ?F (37.1 ?C)  ?SpO2: 97%  ? ? ? ?General: Alert and in no acute distress. ?ENT: ?     Ears: These unremarkable bilaterally.  TMs are minimally bulging bilaterally. ?     Nose: No congestion/rhinnorhea.  Turbinates are boggy. ?     Mouth/Throat: Mucous membranes are moist. ?Neck: No stridor. No cervical spine tenderness to palpation. ?Hematological/Lymphatic/Immunilogical: No cervical lymphadenopathy. ?Cardiovascular:  Good peripheral perfusion ?Respiratory: Normal respiratory effort without tachypnea or retractions. Lungs CTAB. Good air entry to the bases with no decreased or absent breath sounds. ?Musculoskeletal: Full range of motion to all extremities.  ?Neurologic:  No gross focal neurologic deficits are appreciated.  ?Skin:   No rash noted ?Other: ? ? ?ED Results / Procedures / Treatments  ? ?Labs ?(all labs ordered are listed, but only abnormal results are displayed) ?Labs Reviewed - No  data to display ? ? ?EKG ? ? ? ? ?RADIOLOGY ? ? ? ?No results found. ? ?PROCEDURES: ? ?Critical Care performed: No ? ?Procedures ? ? ?MEDICATIONS ORDERED IN ED: ?Medications - No data to display ? ? ?IMPRESSION / MDM / ASSESSMENT AND PLAN / ED COURSE  ?I reviewed the triage vital signs and the nursing notes. ?             ?               ? ?Differential diagnosis includes, but is not limited to, allergic rhinitis with postnasal drip, sinusitis, otitis media, bronchitis ? ? ?Patient's diagnosis is consistent with allergic rhinitis with postnasal drip.  Patient presents the emergency department with significant allergic rhinitis symptoms.  She has congestion, sneezing, coughing, postnasal drip.  Patient is on Allegra and a generic for Flonase already.  She states that she is primarily looking for additional symptom control.  She is already on these medications I will try a short course of steroid.  Encouraged the use of a stronger antihistamine at nighttime such as a Benadryl to help dry up congestion to also help the patient's sleep.  Physical exam was otherwise reassuring.  No indication  for labs or imaging at this time..  Follow-up primary care as needed.  Patient is given ED precautions to return to the ED for any worsening or new symptoms. ? ? ? ?  ? ? ?FINAL CLINICAL IMPRESSION(S) / ED DIAGNOSES  ? ?Final diagnoses:  ?Allergic rhinitis with postnasal drip  ? ? ? ?Rx / DC Orders  ? ?ED Discharge Orders   ? ?      Ordered  ?  predniSONE (STERAPRED UNI-PAK 21 TAB) 10 MG (21) TBPK tablet  Daily       ? 09/13/21 1319  ? ?  ?  ? ?  ? ? ? ?Note:  This document was prepared using Dragon voice recognition software and may include unintentional dictation errors. ?  ?Racheal Patches, PA-C ?09/13/21 1325 ? ?

## 2021-09-13 NOTE — ED Triage Notes (Signed)
Pt present coughing with chest congestion and nasal drainage. Symptoms started four days ago. Pt states tried otc medication with no relief.  ?

## 2021-09-24 DIAGNOSIS — Z961 Presence of intraocular lens: Secondary | ICD-10-CM | POA: Diagnosis not present

## 2021-09-24 DIAGNOSIS — H52203 Unspecified astigmatism, bilateral: Secondary | ICD-10-CM | POA: Diagnosis not present

## 2021-09-24 DIAGNOSIS — H524 Presbyopia: Secondary | ICD-10-CM | POA: Diagnosis not present

## 2021-10-01 DIAGNOSIS — J329 Chronic sinusitis, unspecified: Secondary | ICD-10-CM | POA: Diagnosis not present

## 2021-10-01 DIAGNOSIS — E6609 Other obesity due to excess calories: Secondary | ICD-10-CM | POA: Diagnosis not present

## 2021-10-01 DIAGNOSIS — Z6832 Body mass index (BMI) 32.0-32.9, adult: Secondary | ICD-10-CM | POA: Diagnosis not present

## 2021-10-01 DIAGNOSIS — J302 Other seasonal allergic rhinitis: Secondary | ICD-10-CM | POA: Diagnosis not present

## 2021-12-06 IMAGING — MG DIGITAL SCREENING BILAT W/ TOMO W/ CAD
8 of 14 series · 8 of 40 positions shown · non-contrast
Comparison: Previous exam(s).

CLINICAL DATA: Screening.

EXAM:
DIGITAL SCREENING BILATERAL MAMMOGRAM WITH TOMO AND CAD

[L MLO synth-2D (1 of 2)]
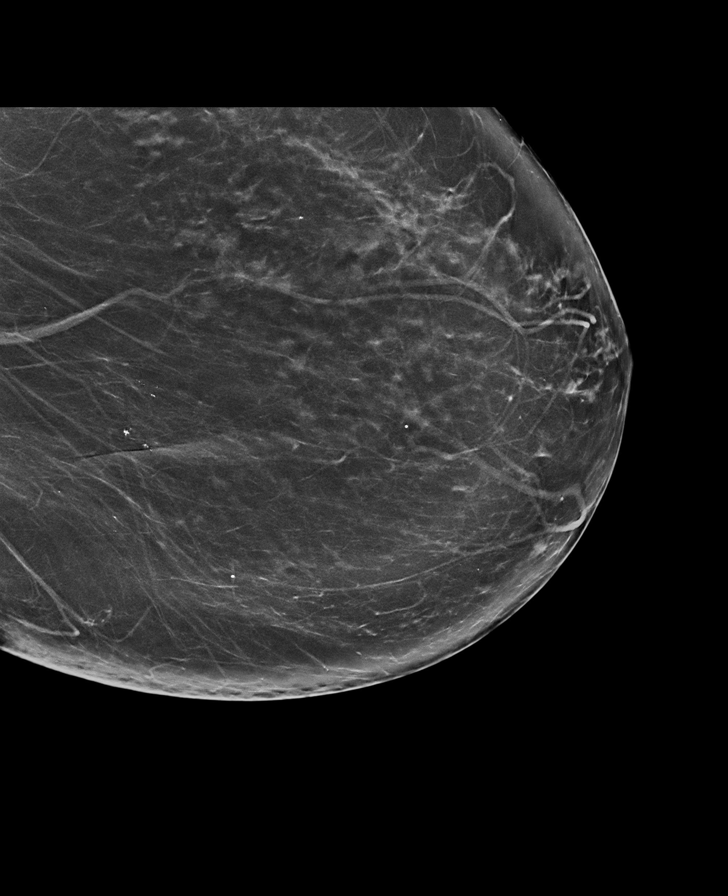

[R CC synth-2D (1 of 2)]
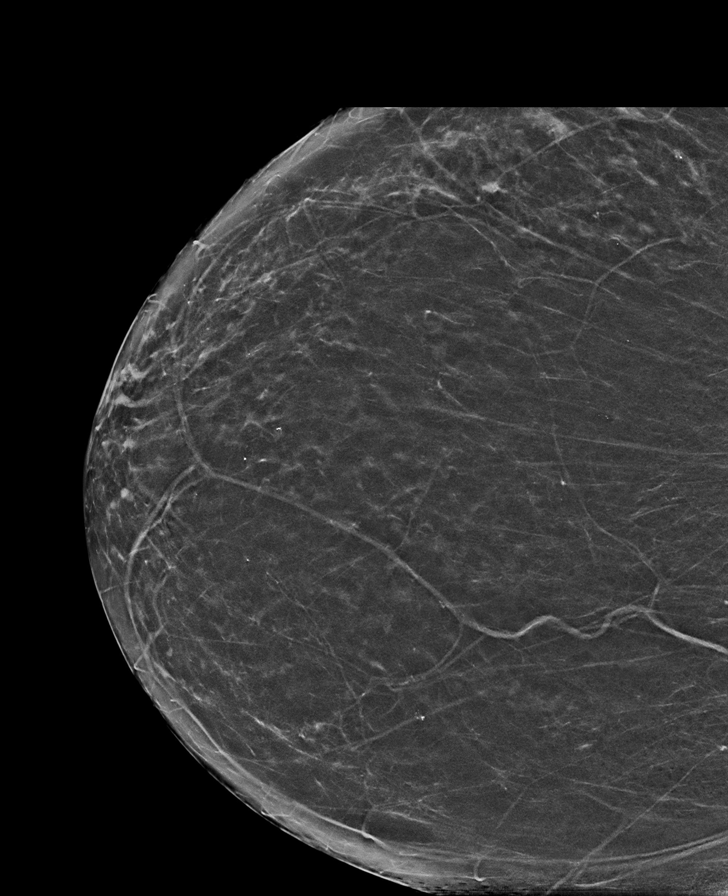

[L MLO synth-2D (2 of 2)]
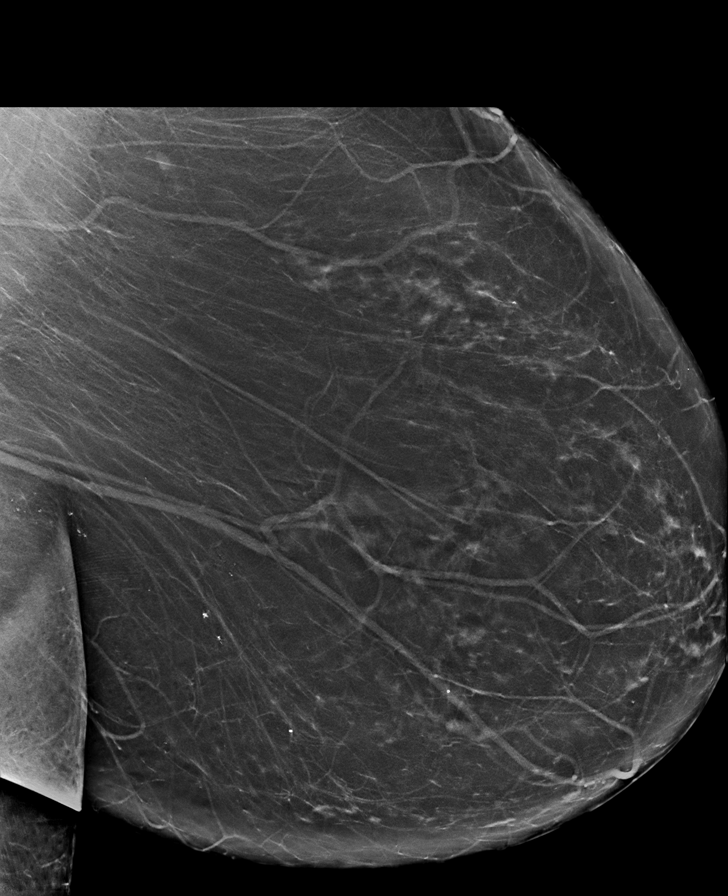

[R MLO synth-2D (1 of 2)]
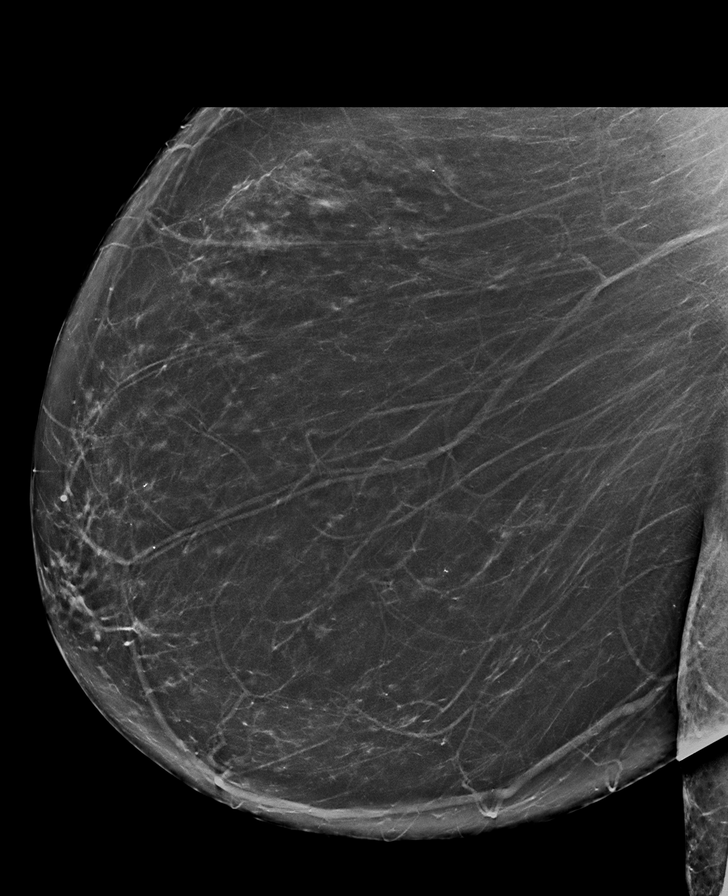

[R CC synth-2D (2 of 2)]
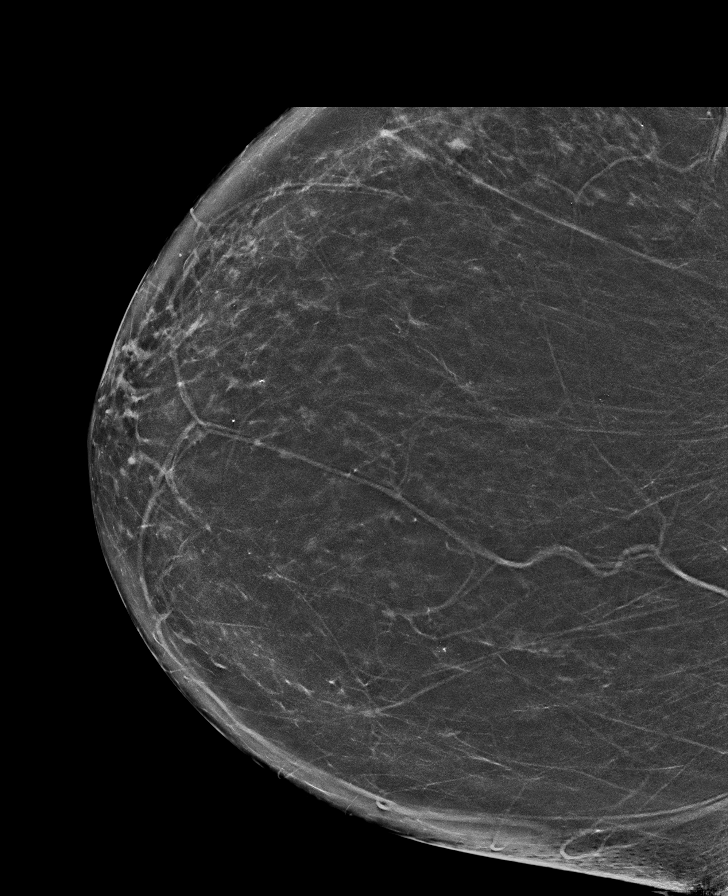

[L CC synth-2D]
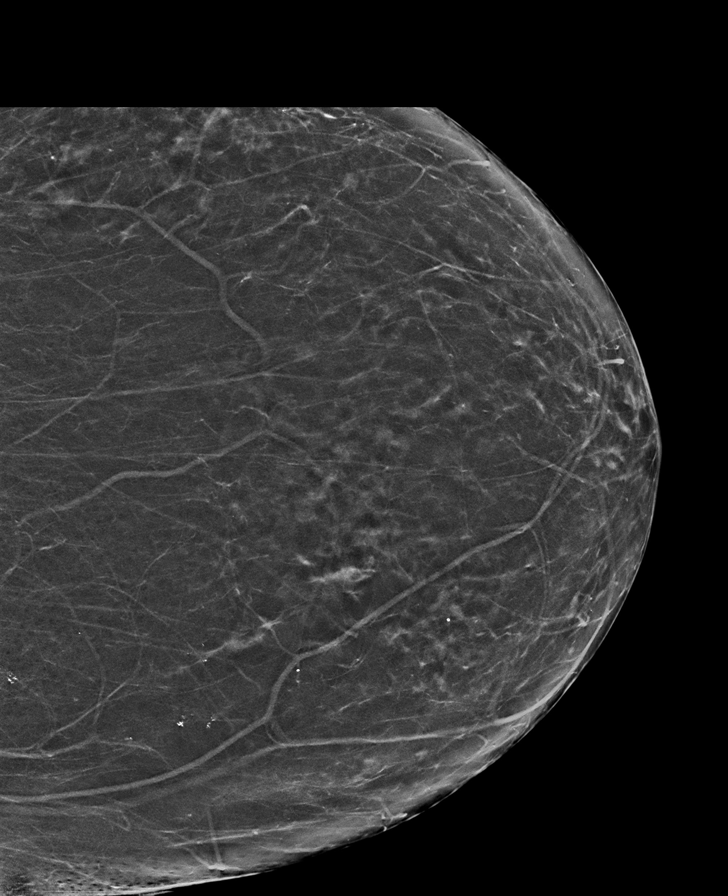

[R MLO synth-2D (2 of 2)]
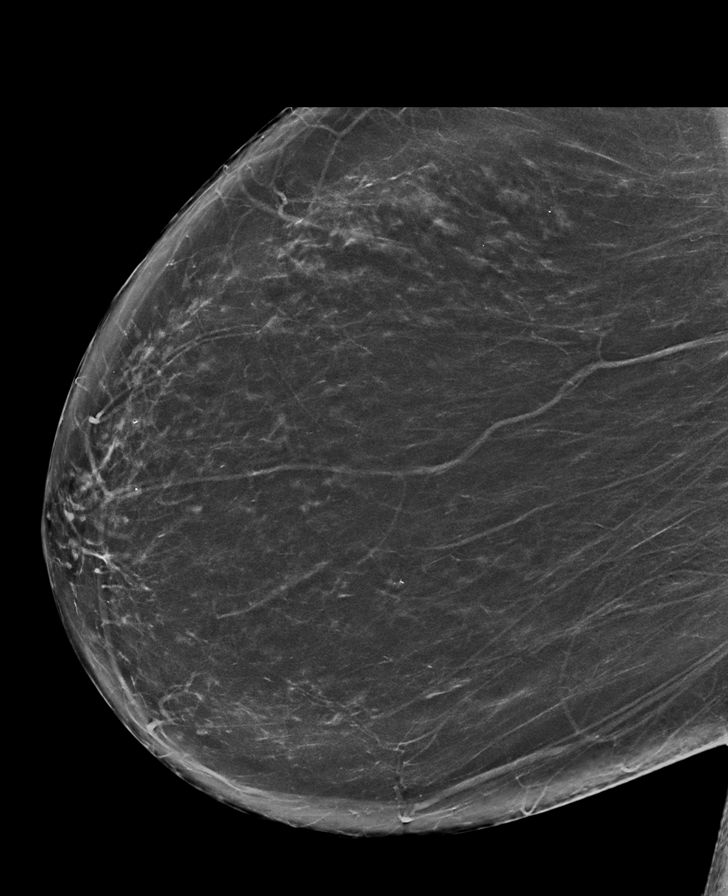

[R CC tomo · tomo slice 39/77.0]
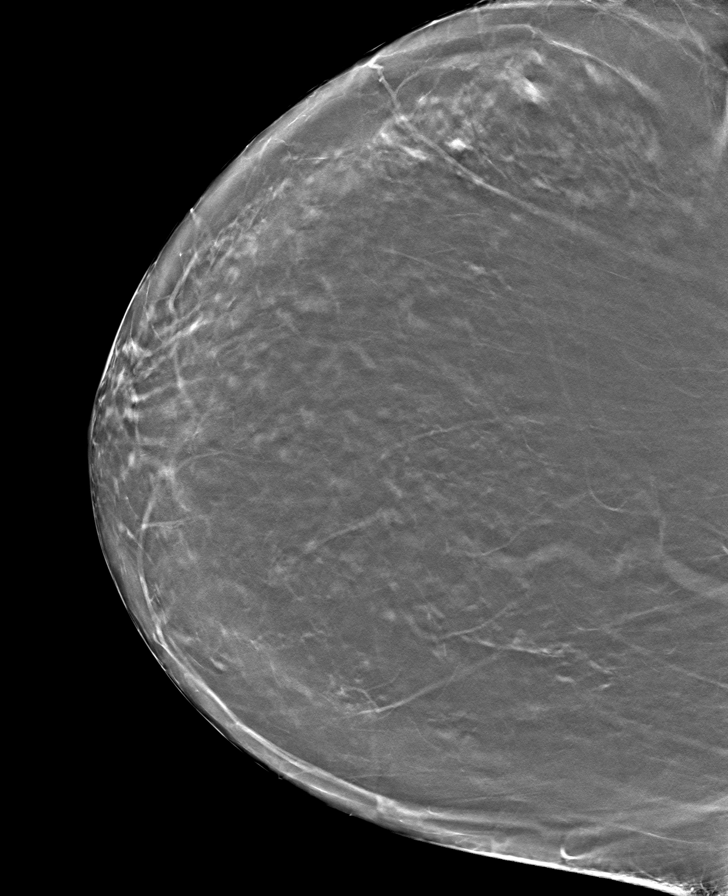

[8 of 40 positions shown; findings below may reference images not displayed]

ACR Breast Density Category b: There are scattered areas of
fibroglandular density.
FINDINGS: In the right breast, calcifications warrant further evaluation with
magnified views. In the left breast, no findings suspicious for
malignancy. Images were processed with CAD.
IMPRESSION: Further evaluation is suggested for calcifications in the right
breast.

RECOMMENDATION:
Diagnostic mammogram of the right breast. (Code:[id number])

The patient will be contacted regarding the findings, and additional
imaging will be scheduled.

BI-RADS CATEGORY  0: Incomplete. Need additional imaging evaluation
and/or prior mammograms for comparison.

## 2021-12-11 DIAGNOSIS — E6609 Other obesity due to excess calories: Secondary | ICD-10-CM | POA: Diagnosis not present

## 2021-12-11 DIAGNOSIS — Z6832 Body mass index (BMI) 32.0-32.9, adult: Secondary | ICD-10-CM | POA: Diagnosis not present

## 2021-12-11 DIAGNOSIS — M1712 Unilateral primary osteoarthritis, left knee: Secondary | ICD-10-CM | POA: Diagnosis not present

## 2021-12-24 DIAGNOSIS — M25562 Pain in left knee: Secondary | ICD-10-CM | POA: Diagnosis not present

## 2022-01-06 ENCOUNTER — Other Ambulatory Visit: Payer: Self-pay | Admitting: Family Medicine

## 2022-01-06 DIAGNOSIS — Z1231 Encounter for screening mammogram for malignant neoplasm of breast: Secondary | ICD-10-CM

## 2022-01-14 ENCOUNTER — Ambulatory Visit
Admission: RE | Admit: 2022-01-14 | Discharge: 2022-01-14 | Disposition: A | Discharge status: Home / Self Care | Payer: Medicare PPO | Source: Ambulatory Visit | Attending: Family Medicine | Admitting: Family Medicine

## 2022-01-14 DIAGNOSIS — Z1231 Encounter for screening mammogram for malignant neoplasm of breast: Secondary | ICD-10-CM | POA: Diagnosis not present

## 2022-03-23 DIAGNOSIS — F419 Anxiety disorder, unspecified: Secondary | ICD-10-CM | POA: Diagnosis not present

## 2022-03-23 DIAGNOSIS — E7849 Other hyperlipidemia: Secondary | ICD-10-CM | POA: Diagnosis not present

## 2022-03-23 DIAGNOSIS — E119 Type 2 diabetes mellitus without complications: Secondary | ICD-10-CM | POA: Diagnosis not present

## 2022-03-23 DIAGNOSIS — J329 Chronic sinusitis, unspecified: Secondary | ICD-10-CM | POA: Diagnosis not present

## 2022-03-23 DIAGNOSIS — Z23 Encounter for immunization: Secondary | ICD-10-CM | POA: Diagnosis not present

## 2022-03-23 DIAGNOSIS — R945 Abnormal results of liver function studies: Secondary | ICD-10-CM | POA: Diagnosis not present

## 2022-03-23 DIAGNOSIS — I10 Essential (primary) hypertension: Secondary | ICD-10-CM | POA: Diagnosis not present

## 2022-03-23 DIAGNOSIS — Z6832 Body mass index (BMI) 32.0-32.9, adult: Secondary | ICD-10-CM | POA: Diagnosis not present

## 2022-03-23 DIAGNOSIS — E559 Vitamin D deficiency, unspecified: Secondary | ICD-10-CM | POA: Diagnosis not present

## 2022-03-23 DIAGNOSIS — E782 Mixed hyperlipidemia: Secondary | ICD-10-CM | POA: Diagnosis not present

## 2022-06-09 DIAGNOSIS — Z6832 Body mass index (BMI) 32.0-32.9, adult: Secondary | ICD-10-CM | POA: Diagnosis not present

## 2022-06-09 DIAGNOSIS — J302 Other seasonal allergic rhinitis: Secondary | ICD-10-CM | POA: Diagnosis not present

## 2022-06-09 DIAGNOSIS — E6609 Other obesity due to excess calories: Secondary | ICD-10-CM | POA: Diagnosis not present

## 2022-08-25 DIAGNOSIS — Z6832 Body mass index (BMI) 32.0-32.9, adult: Secondary | ICD-10-CM | POA: Diagnosis not present

## 2022-08-25 DIAGNOSIS — E559 Vitamin D deficiency, unspecified: Secondary | ICD-10-CM | POA: Diagnosis not present

## 2022-08-25 DIAGNOSIS — E1159 Type 2 diabetes mellitus with other circulatory complications: Secondary | ICD-10-CM | POA: Diagnosis not present

## 2022-08-25 DIAGNOSIS — E7849 Other hyperlipidemia: Secondary | ICD-10-CM | POA: Diagnosis not present

## 2022-08-25 DIAGNOSIS — E782 Mixed hyperlipidemia: Secondary | ICD-10-CM | POA: Diagnosis not present

## 2022-08-25 DIAGNOSIS — I10 Essential (primary) hypertension: Secondary | ICD-10-CM | POA: Diagnosis not present

## 2022-08-25 DIAGNOSIS — T461X5A Adverse effect of calcium-channel blockers, initial encounter: Secondary | ICD-10-CM | POA: Diagnosis not present

## 2022-08-25 DIAGNOSIS — R945 Abnormal results of liver function studies: Secondary | ICD-10-CM | POA: Diagnosis not present

## 2022-08-25 DIAGNOSIS — Z024 Encounter for examination for driving license: Secondary | ICD-10-CM | POA: Diagnosis not present

## 2022-08-25 DIAGNOSIS — M19011 Primary osteoarthritis, right shoulder: Secondary | ICD-10-CM | POA: Diagnosis not present

## 2022-10-27 ENCOUNTER — Other Ambulatory Visit (HOSPITAL_COMMUNITY): Payer: Self-pay | Admitting: Family Medicine

## 2022-10-27 DIAGNOSIS — M19011 Primary osteoarthritis, right shoulder: Secondary | ICD-10-CM

## 2022-10-27 DIAGNOSIS — M159 Polyosteoarthritis, unspecified: Secondary | ICD-10-CM | POA: Diagnosis not present

## 2022-10-27 DIAGNOSIS — E6609 Other obesity due to excess calories: Secondary | ICD-10-CM | POA: Diagnosis not present

## 2022-10-27 DIAGNOSIS — Z6832 Body mass index (BMI) 32.0-32.9, adult: Secondary | ICD-10-CM | POA: Diagnosis not present

## 2022-10-28 ENCOUNTER — Ambulatory Visit (HOSPITAL_COMMUNITY)
Admission: RE | Admit: 2022-10-28 | Discharge: 2022-10-28 | Disposition: A | Discharge status: Home / Self Care | Payer: Medicare PPO | Source: Ambulatory Visit | Attending: Family Medicine | Admitting: Family Medicine

## 2022-10-28 DIAGNOSIS — M25511 Pain in right shoulder: Secondary | ICD-10-CM | POA: Diagnosis not present

## 2022-10-28 DIAGNOSIS — M19011 Primary osteoarthritis, right shoulder: Secondary | ICD-10-CM | POA: Insufficient documentation

## 2022-12-02 ENCOUNTER — Other Ambulatory Visit: Payer: Self-pay | Admitting: Family Medicine

## 2022-12-02 DIAGNOSIS — Z1231 Encounter for screening mammogram for malignant neoplasm of breast: Secondary | ICD-10-CM

## 2022-12-18 DIAGNOSIS — M25511 Pain in right shoulder: Secondary | ICD-10-CM | POA: Diagnosis not present

## 2022-12-23 DIAGNOSIS — H52203 Unspecified astigmatism, bilateral: Secondary | ICD-10-CM | POA: Diagnosis not present

## 2022-12-23 DIAGNOSIS — H5213 Myopia, bilateral: Secondary | ICD-10-CM | POA: Diagnosis not present

## 2022-12-23 DIAGNOSIS — Z961 Presence of intraocular lens: Secondary | ICD-10-CM | POA: Diagnosis not present

## 2022-12-31 DIAGNOSIS — M25611 Stiffness of right shoulder, not elsewhere classified: Secondary | ICD-10-CM | POA: Diagnosis not present

## 2022-12-31 DIAGNOSIS — M6281 Muscle weakness (generalized): Secondary | ICD-10-CM | POA: Diagnosis not present

## 2022-12-31 DIAGNOSIS — S46011D Strain of muscle(s) and tendon(s) of the rotator cuff of right shoulder, subsequent encounter: Secondary | ICD-10-CM | POA: Diagnosis not present

## 2023-01-08 DIAGNOSIS — M25611 Stiffness of right shoulder, not elsewhere classified: Secondary | ICD-10-CM | POA: Diagnosis not present

## 2023-01-08 DIAGNOSIS — M6281 Muscle weakness (generalized): Secondary | ICD-10-CM | POA: Diagnosis not present

## 2023-01-08 DIAGNOSIS — S46011D Strain of muscle(s) and tendon(s) of the rotator cuff of right shoulder, subsequent encounter: Secondary | ICD-10-CM | POA: Diagnosis not present

## 2023-01-15 DIAGNOSIS — M25611 Stiffness of right shoulder, not elsewhere classified: Secondary | ICD-10-CM | POA: Diagnosis not present

## 2023-01-15 DIAGNOSIS — M6281 Muscle weakness (generalized): Secondary | ICD-10-CM | POA: Diagnosis not present

## 2023-01-15 DIAGNOSIS — S46011D Strain of muscle(s) and tendon(s) of the rotator cuff of right shoulder, subsequent encounter: Secondary | ICD-10-CM | POA: Diagnosis not present

## 2023-01-18 ENCOUNTER — Ambulatory Visit
Admission: RE | Admit: 2023-01-18 | Discharge: 2023-01-18 | Disposition: A | Discharge status: Home / Self Care | Payer: Medicare PPO | Source: Ambulatory Visit | Attending: Family Medicine | Admitting: Family Medicine

## 2023-01-18 DIAGNOSIS — Z1231 Encounter for screening mammogram for malignant neoplasm of breast: Secondary | ICD-10-CM | POA: Diagnosis not present

## 2023-01-29 DIAGNOSIS — S46011D Strain of muscle(s) and tendon(s) of the rotator cuff of right shoulder, subsequent encounter: Secondary | ICD-10-CM | POA: Diagnosis not present

## 2023-02-05 DIAGNOSIS — M25511 Pain in right shoulder: Secondary | ICD-10-CM | POA: Diagnosis not present

## 2023-02-12 DIAGNOSIS — Z6832 Body mass index (BMI) 32.0-32.9, adult: Secondary | ICD-10-CM | POA: Diagnosis not present

## 2023-02-12 DIAGNOSIS — E6609 Other obesity due to excess calories: Secondary | ICD-10-CM | POA: Diagnosis not present

## 2023-02-12 DIAGNOSIS — J329 Chronic sinusitis, unspecified: Secondary | ICD-10-CM | POA: Diagnosis not present

## 2023-02-15 DIAGNOSIS — S46011A Strain of muscle(s) and tendon(s) of the rotator cuff of right shoulder, initial encounter: Secondary | ICD-10-CM | POA: Diagnosis not present

## 2023-03-18 DIAGNOSIS — M19011 Primary osteoarthritis, right shoulder: Secondary | ICD-10-CM | POA: Diagnosis not present

## 2023-03-18 DIAGNOSIS — S46011A Strain of muscle(s) and tendon(s) of the rotator cuff of right shoulder, initial encounter: Secondary | ICD-10-CM | POA: Diagnosis not present

## 2023-03-18 DIAGNOSIS — M7521 Bicipital tendinitis, right shoulder: Secondary | ICD-10-CM | POA: Diagnosis not present

## 2023-03-18 DIAGNOSIS — M24111 Other articular cartilage disorders, right shoulder: Secondary | ICD-10-CM | POA: Diagnosis not present

## 2023-03-18 DIAGNOSIS — M7541 Impingement syndrome of right shoulder: Secondary | ICD-10-CM | POA: Diagnosis not present

## 2023-03-18 DIAGNOSIS — G8918 Other acute postprocedural pain: Secondary | ICD-10-CM | POA: Diagnosis not present

## 2023-03-18 DIAGNOSIS — M7551 Bursitis of right shoulder: Secondary | ICD-10-CM | POA: Diagnosis not present

## 2023-03-23 DIAGNOSIS — M75121 Complete rotator cuff tear or rupture of right shoulder, not specified as traumatic: Secondary | ICD-10-CM | POA: Diagnosis not present

## 2023-03-23 DIAGNOSIS — M6281 Muscle weakness (generalized): Secondary | ICD-10-CM | POA: Diagnosis not present

## 2023-03-23 DIAGNOSIS — M25611 Stiffness of right shoulder, not elsewhere classified: Secondary | ICD-10-CM | POA: Diagnosis not present

## 2023-03-25 DIAGNOSIS — M25611 Stiffness of right shoulder, not elsewhere classified: Secondary | ICD-10-CM | POA: Diagnosis not present

## 2023-03-25 DIAGNOSIS — M75121 Complete rotator cuff tear or rupture of right shoulder, not specified as traumatic: Secondary | ICD-10-CM | POA: Diagnosis not present

## 2023-03-25 DIAGNOSIS — M6281 Muscle weakness (generalized): Secondary | ICD-10-CM | POA: Diagnosis not present

## 2023-03-29 DIAGNOSIS — M6281 Muscle weakness (generalized): Secondary | ICD-10-CM | POA: Diagnosis not present

## 2023-03-29 DIAGNOSIS — M75121 Complete rotator cuff tear or rupture of right shoulder, not specified as traumatic: Secondary | ICD-10-CM | POA: Diagnosis not present

## 2023-03-29 DIAGNOSIS — M25611 Stiffness of right shoulder, not elsewhere classified: Secondary | ICD-10-CM | POA: Diagnosis not present

## 2023-03-30 DIAGNOSIS — E559 Vitamin D deficiency, unspecified: Secondary | ICD-10-CM | POA: Diagnosis not present

## 2023-03-30 DIAGNOSIS — E6609 Other obesity due to excess calories: Secondary | ICD-10-CM | POA: Diagnosis not present

## 2023-03-30 DIAGNOSIS — E7849 Other hyperlipidemia: Secondary | ICD-10-CM | POA: Diagnosis not present

## 2023-03-30 DIAGNOSIS — E1159 Type 2 diabetes mellitus with other circulatory complications: Secondary | ICD-10-CM | POA: Diagnosis not present

## 2023-03-30 DIAGNOSIS — M19011 Primary osteoarthritis, right shoulder: Secondary | ICD-10-CM | POA: Diagnosis not present

## 2023-03-30 DIAGNOSIS — E119 Type 2 diabetes mellitus without complications: Secondary | ICD-10-CM | POA: Diagnosis not present

## 2023-03-30 DIAGNOSIS — R945 Abnormal results of liver function studies: Secondary | ICD-10-CM | POA: Diagnosis not present

## 2023-03-30 DIAGNOSIS — T461X5A Adverse effect of calcium-channel blockers, initial encounter: Secondary | ICD-10-CM | POA: Diagnosis not present

## 2023-03-30 DIAGNOSIS — R7309 Other abnormal glucose: Secondary | ICD-10-CM | POA: Diagnosis not present

## 2023-03-30 DIAGNOSIS — I10 Essential (primary) hypertension: Secondary | ICD-10-CM | POA: Diagnosis not present

## 2023-03-30 DIAGNOSIS — Z6832 Body mass index (BMI) 32.0-32.9, adult: Secondary | ICD-10-CM | POA: Diagnosis not present

## 2023-03-30 DIAGNOSIS — E782 Mixed hyperlipidemia: Secondary | ICD-10-CM | POA: Diagnosis not present

## 2023-04-01 DIAGNOSIS — M75121 Complete rotator cuff tear or rupture of right shoulder, not specified as traumatic: Secondary | ICD-10-CM | POA: Diagnosis not present

## 2023-04-01 DIAGNOSIS — M25611 Stiffness of right shoulder, not elsewhere classified: Secondary | ICD-10-CM | POA: Diagnosis not present

## 2023-04-01 DIAGNOSIS — M6281 Muscle weakness (generalized): Secondary | ICD-10-CM | POA: Diagnosis not present

## 2023-04-05 DIAGNOSIS — M25611 Stiffness of right shoulder, not elsewhere classified: Secondary | ICD-10-CM | POA: Diagnosis not present

## 2023-04-05 DIAGNOSIS — M6281 Muscle weakness (generalized): Secondary | ICD-10-CM | POA: Diagnosis not present

## 2023-04-05 DIAGNOSIS — M75121 Complete rotator cuff tear or rupture of right shoulder, not specified as traumatic: Secondary | ICD-10-CM | POA: Diagnosis not present

## 2023-04-08 DIAGNOSIS — M6281 Muscle weakness (generalized): Secondary | ICD-10-CM | POA: Diagnosis not present

## 2023-04-08 DIAGNOSIS — M25611 Stiffness of right shoulder, not elsewhere classified: Secondary | ICD-10-CM | POA: Diagnosis not present

## 2023-04-08 DIAGNOSIS — M75121 Complete rotator cuff tear or rupture of right shoulder, not specified as traumatic: Secondary | ICD-10-CM | POA: Diagnosis not present

## 2023-04-13 DIAGNOSIS — M25611 Stiffness of right shoulder, not elsewhere classified: Secondary | ICD-10-CM | POA: Diagnosis not present

## 2023-04-13 DIAGNOSIS — M6281 Muscle weakness (generalized): Secondary | ICD-10-CM | POA: Diagnosis not present

## 2023-04-13 DIAGNOSIS — M75121 Complete rotator cuff tear or rupture of right shoulder, not specified as traumatic: Secondary | ICD-10-CM | POA: Diagnosis not present

## 2023-04-15 DIAGNOSIS — M6281 Muscle weakness (generalized): Secondary | ICD-10-CM | POA: Diagnosis not present

## 2023-04-15 DIAGNOSIS — M75121 Complete rotator cuff tear or rupture of right shoulder, not specified as traumatic: Secondary | ICD-10-CM | POA: Diagnosis not present

## 2023-04-15 DIAGNOSIS — M25611 Stiffness of right shoulder, not elsewhere classified: Secondary | ICD-10-CM | POA: Diagnosis not present

## 2023-04-20 DIAGNOSIS — M75121 Complete rotator cuff tear or rupture of right shoulder, not specified as traumatic: Secondary | ICD-10-CM | POA: Diagnosis not present

## 2023-04-20 DIAGNOSIS — M25611 Stiffness of right shoulder, not elsewhere classified: Secondary | ICD-10-CM | POA: Diagnosis not present

## 2023-04-20 DIAGNOSIS — M6281 Muscle weakness (generalized): Secondary | ICD-10-CM | POA: Diagnosis not present

## 2023-04-22 DIAGNOSIS — M6281 Muscle weakness (generalized): Secondary | ICD-10-CM | POA: Diagnosis not present

## 2023-04-22 DIAGNOSIS — M25611 Stiffness of right shoulder, not elsewhere classified: Secondary | ICD-10-CM | POA: Diagnosis not present

## 2023-04-22 DIAGNOSIS — M75121 Complete rotator cuff tear or rupture of right shoulder, not specified as traumatic: Secondary | ICD-10-CM | POA: Diagnosis not present

## 2023-04-29 DIAGNOSIS — M75121 Complete rotator cuff tear or rupture of right shoulder, not specified as traumatic: Secondary | ICD-10-CM | POA: Diagnosis not present

## 2023-04-29 DIAGNOSIS — M25611 Stiffness of right shoulder, not elsewhere classified: Secondary | ICD-10-CM | POA: Diagnosis not present

## 2023-04-29 DIAGNOSIS — M6281 Muscle weakness (generalized): Secondary | ICD-10-CM | POA: Diagnosis not present

## 2023-05-04 DIAGNOSIS — M6281 Muscle weakness (generalized): Secondary | ICD-10-CM | POA: Diagnosis not present

## 2023-05-04 DIAGNOSIS — M75121 Complete rotator cuff tear or rupture of right shoulder, not specified as traumatic: Secondary | ICD-10-CM | POA: Diagnosis not present

## 2023-05-04 DIAGNOSIS — M25611 Stiffness of right shoulder, not elsewhere classified: Secondary | ICD-10-CM | POA: Diagnosis not present

## 2023-05-06 DIAGNOSIS — M25611 Stiffness of right shoulder, not elsewhere classified: Secondary | ICD-10-CM | POA: Diagnosis not present

## 2023-05-06 DIAGNOSIS — M75121 Complete rotator cuff tear or rupture of right shoulder, not specified as traumatic: Secondary | ICD-10-CM | POA: Diagnosis not present

## 2023-05-06 DIAGNOSIS — M6281 Muscle weakness (generalized): Secondary | ICD-10-CM | POA: Diagnosis not present

## 2023-05-11 DIAGNOSIS — M6281 Muscle weakness (generalized): Secondary | ICD-10-CM | POA: Diagnosis not present

## 2023-05-11 DIAGNOSIS — M25611 Stiffness of right shoulder, not elsewhere classified: Secondary | ICD-10-CM | POA: Diagnosis not present

## 2023-05-11 DIAGNOSIS — M75121 Complete rotator cuff tear or rupture of right shoulder, not specified as traumatic: Secondary | ICD-10-CM | POA: Diagnosis not present

## 2023-05-13 DIAGNOSIS — M25611 Stiffness of right shoulder, not elsewhere classified: Secondary | ICD-10-CM | POA: Diagnosis not present

## 2023-05-13 DIAGNOSIS — M75121 Complete rotator cuff tear or rupture of right shoulder, not specified as traumatic: Secondary | ICD-10-CM | POA: Diagnosis not present

## 2023-05-13 DIAGNOSIS — M6281 Muscle weakness (generalized): Secondary | ICD-10-CM | POA: Diagnosis not present

## 2023-05-18 DIAGNOSIS — M6281 Muscle weakness (generalized): Secondary | ICD-10-CM | POA: Diagnosis not present

## 2023-05-18 DIAGNOSIS — M75121 Complete rotator cuff tear or rupture of right shoulder, not specified as traumatic: Secondary | ICD-10-CM | POA: Diagnosis not present

## 2023-05-18 DIAGNOSIS — M25611 Stiffness of right shoulder, not elsewhere classified: Secondary | ICD-10-CM | POA: Diagnosis not present

## 2023-05-25 DIAGNOSIS — M75121 Complete rotator cuff tear or rupture of right shoulder, not specified as traumatic: Secondary | ICD-10-CM | POA: Diagnosis not present

## 2023-05-25 DIAGNOSIS — M6281 Muscle weakness (generalized): Secondary | ICD-10-CM | POA: Diagnosis not present

## 2023-05-25 DIAGNOSIS — M25611 Stiffness of right shoulder, not elsewhere classified: Secondary | ICD-10-CM | POA: Diagnosis not present

## 2023-05-27 DIAGNOSIS — M75121 Complete rotator cuff tear or rupture of right shoulder, not specified as traumatic: Secondary | ICD-10-CM | POA: Diagnosis not present

## 2023-05-27 DIAGNOSIS — M25611 Stiffness of right shoulder, not elsewhere classified: Secondary | ICD-10-CM | POA: Diagnosis not present

## 2023-05-27 DIAGNOSIS — M6281 Muscle weakness (generalized): Secondary | ICD-10-CM | POA: Diagnosis not present

## 2023-06-04 DIAGNOSIS — M75121 Complete rotator cuff tear or rupture of right shoulder, not specified as traumatic: Secondary | ICD-10-CM | POA: Diagnosis not present

## 2023-06-04 DIAGNOSIS — M6281 Muscle weakness (generalized): Secondary | ICD-10-CM | POA: Diagnosis not present

## 2023-06-04 DIAGNOSIS — M25611 Stiffness of right shoulder, not elsewhere classified: Secondary | ICD-10-CM | POA: Diagnosis not present

## 2023-06-21 DIAGNOSIS — M6281 Muscle weakness (generalized): Secondary | ICD-10-CM | POA: Diagnosis not present

## 2023-06-21 DIAGNOSIS — M25611 Stiffness of right shoulder, not elsewhere classified: Secondary | ICD-10-CM | POA: Diagnosis not present

## 2023-06-21 DIAGNOSIS — M75121 Complete rotator cuff tear or rupture of right shoulder, not specified as traumatic: Secondary | ICD-10-CM | POA: Diagnosis not present

## 2023-06-28 DIAGNOSIS — M75121 Complete rotator cuff tear or rupture of right shoulder, not specified as traumatic: Secondary | ICD-10-CM | POA: Diagnosis not present

## 2023-07-27 DIAGNOSIS — Z0001 Encounter for general adult medical examination with abnormal findings: Secondary | ICD-10-CM | POA: Diagnosis not present

## 2023-07-27 DIAGNOSIS — I10 Essential (primary) hypertension: Secondary | ICD-10-CM | POA: Diagnosis not present

## 2023-07-27 DIAGNOSIS — E782 Mixed hyperlipidemia: Secondary | ICD-10-CM | POA: Diagnosis not present

## 2023-07-27 DIAGNOSIS — Z1331 Encounter for screening for depression: Secondary | ICD-10-CM | POA: Diagnosis not present

## 2023-07-27 DIAGNOSIS — Z6832 Body mass index (BMI) 32.0-32.9, adult: Secondary | ICD-10-CM | POA: Diagnosis not present

## 2023-07-27 DIAGNOSIS — R7309 Other abnormal glucose: Secondary | ICD-10-CM | POA: Diagnosis not present

## 2023-07-27 DIAGNOSIS — E6609 Other obesity due to excess calories: Secondary | ICD-10-CM | POA: Diagnosis not present

## 2023-10-25 DIAGNOSIS — Z6832 Body mass index (BMI) 32.0-32.9, adult: Secondary | ICD-10-CM | POA: Diagnosis not present

## 2023-10-25 DIAGNOSIS — N390 Urinary tract infection, site not specified: Secondary | ICD-10-CM | POA: Diagnosis not present

## 2023-10-25 DIAGNOSIS — E6609 Other obesity due to excess calories: Secondary | ICD-10-CM | POA: Diagnosis not present

## 2023-11-17 DIAGNOSIS — E6609 Other obesity due to excess calories: Secondary | ICD-10-CM | POA: Diagnosis not present

## 2023-11-17 DIAGNOSIS — Z6832 Body mass index (BMI) 32.0-32.9, adult: Secondary | ICD-10-CM | POA: Diagnosis not present

## 2023-11-17 DIAGNOSIS — R300 Dysuria: Secondary | ICD-10-CM | POA: Diagnosis not present

## 2023-12-09 DIAGNOSIS — D23121 Other benign neoplasm of skin of left upper eyelid, including canthus: Secondary | ICD-10-CM | POA: Diagnosis not present

## 2023-12-21 ENCOUNTER — Emergency Department (HOSPITAL_COMMUNITY)
Admission: EM | Admit: 2023-12-21 | Discharge: 2023-12-21 | Disposition: A | Discharge status: Home / Self Care | Attending: Emergency Medicine | Admitting: Emergency Medicine

## 2023-12-21 ENCOUNTER — Other Ambulatory Visit: Payer: Self-pay

## 2023-12-21 ENCOUNTER — Emergency Department (HOSPITAL_COMMUNITY)

## 2023-12-21 ENCOUNTER — Encounter (HOSPITAL_COMMUNITY): Payer: Self-pay

## 2023-12-21 DIAGNOSIS — R42 Dizziness and giddiness: Secondary | ICD-10-CM | POA: Diagnosis not present

## 2023-12-21 DIAGNOSIS — D72819 Decreased white blood cell count, unspecified: Secondary | ICD-10-CM | POA: Diagnosis not present

## 2023-12-21 DIAGNOSIS — E1165 Type 2 diabetes mellitus with hyperglycemia: Secondary | ICD-10-CM | POA: Diagnosis not present

## 2023-12-21 DIAGNOSIS — R0789 Other chest pain: Secondary | ICD-10-CM | POA: Diagnosis not present

## 2023-12-21 DIAGNOSIS — R110 Nausea: Secondary | ICD-10-CM | POA: Insufficient documentation

## 2023-12-21 DIAGNOSIS — R079 Chest pain, unspecified: Secondary | ICD-10-CM | POA: Diagnosis not present

## 2023-12-21 DIAGNOSIS — R072 Precordial pain: Secondary | ICD-10-CM | POA: Diagnosis not present

## 2023-12-21 DIAGNOSIS — R739 Hyperglycemia, unspecified: Secondary | ICD-10-CM

## 2023-12-21 LAB — CBC WITH DIFFERENTIAL/PLATELET
Abs Immature Granulocytes: 0.02 10*3/uL (ref 0.00–0.07)
Basophils Absolute: 0 10*3/uL (ref 0.0–0.1)
Basophils Relative: 1 %
Eosinophils Absolute: 0.1 10*3/uL (ref 0.0–0.5)
Eosinophils Relative: 3 %
HCT: 39.3 % (ref 36.0–46.0)
Hemoglobin: 13 g/dL (ref 12.0–15.0)
Immature Granulocytes: 1 %
Lymphocytes Relative: 31 %
Lymphs Abs: 1.2 10*3/uL (ref 0.7–4.0)
MCH: 29.7 pg (ref 26.0–34.0)
MCHC: 33.1 g/dL (ref 30.0–36.0)
MCV: 89.9 fL (ref 80.0–100.0)
Monocytes Absolute: 0.3 10*3/uL (ref 0.1–1.0)
Monocytes Relative: 8 %
Neutro Abs: 2.3 10*3/uL (ref 1.7–7.7)
Neutrophils Relative %: 56 %
Platelets: 265 10*3/uL (ref 150–400)
RBC: 4.37 MIL/uL (ref 3.87–5.11)
RDW: 13.6 % (ref 11.5–15.5)
WBC: 3.9 10*3/uL — ABNORMAL LOW (ref 4.0–10.5)
nRBC: 0 % (ref 0.0–0.2)

## 2023-12-21 LAB — COMPREHENSIVE METABOLIC PANEL WITH GFR
ALT: 19 U/L (ref 0–44)
AST: 20 U/L (ref 15–41)
Albumin: 3.9 g/dL (ref 3.5–5.0)
Alkaline Phosphatase: 62 U/L (ref 38–126)
Anion gap: 8 (ref 5–15)
BUN: 15 mg/dL (ref 8–23)
CO2: 24 mmol/L (ref 22–32)
Calcium: 9.2 mg/dL (ref 8.9–10.3)
Chloride: 107 mmol/L (ref 98–111)
Creatinine, Ser: 1.15 mg/dL — ABNORMAL HIGH (ref 0.44–1.00)
GFR, Estimated: 48 mL/min — ABNORMAL LOW (ref >60)
Glucose, Bld: 175 mg/dL — ABNORMAL HIGH (ref 70–99)
Potassium: 4.2 mmol/L (ref 3.5–5.1)
Sodium: 139 mmol/L (ref 135–145)
Total Bilirubin: 0.7 mg/dL (ref 0.0–1.2)
Total Protein: 6.9 g/dL (ref 6.5–8.1)

## 2023-12-21 LAB — TROPONIN I (HIGH SENSITIVITY)
Troponin I (High Sensitivity): 7 ng/L (ref <18)
Troponin I (High Sensitivity): 10 ng/L (ref <18)

## 2023-12-21 MED ORDER — FAMOTIDINE 20 MG PO TABS
20.0000 mg | ORAL_TABLET | Freq: Once | ORAL | Status: AC
  Administered 2023-12-21: 20 mg via ORAL
  Filled 2023-12-21: qty 1

## 2023-12-21 MED ORDER — ALUM & MAG HYDROXIDE-SIMETH 200-200-20 MG/5ML PO SUSP
30.0000 mL | Freq: Once | ORAL | Status: AC
  Administered 2023-12-21: 30 mL via ORAL
  Filled 2023-12-21: qty 30

## 2023-12-21 MED ORDER — ACETAMINOPHEN 500 MG PO TABS
1000.0000 mg | ORAL_TABLET | Freq: Once | ORAL | Status: DC
  Filled 2023-12-21: qty 2

## 2023-12-21 NOTE — Discharge Instructions (Addendum)
 It was our pleasure to provide your ER care today - we hope that you feel better.  Drink plenty of fluids/stay well hydrated. Your blood sugar is elevated today - drink adequate water , follow diabetes meal plan, and follow up with your doctor this coming week.   For recent chest pain, follow up closely with cardiologist in the next 1-2 weeks - we made referral, and they should be contacting you with an appointment in the next few days.   Return to ER right away if worse, new symptoms, fevers, recurrent/persistent chest pain, increased trouble breathing, or other concern.

## 2023-12-21 NOTE — ED Triage Notes (Signed)
 Pt arrived via POV c/o left chest tightness, dizziness, nausea and feeling lightheaded after waking up this morning.

## 2023-12-21 NOTE — ED Provider Notes (Signed)
 [place] EMERGENCY DEPARTMENT AT [facility] Provider Note   CSN: [id number] Arrival date & time: 12/21/23  1244     Patient presents with: Chest Pain   Samantha Oliver is a 78 y.o. female.   Pt c/o midline lower sternal area chest didscomfort at rest since this AM. Indicates when stood felt faint/dizzy. No problems w gait or balance. No falls. No ear pain, tinnitus or hearing loss. No syncope. No palpitations. No sob. +nausea. No headache. Denies associated change in speech or vision. No new numbness or weakness. No problems w normal functional ability  or coordination. Denies blood loss, rectal bleeding or melena. No abd pain or nvd. No dysuria or gu c/o. No extremity pain or swelling.   The history is provided by the patient and medical records.  Chest Pain Associated symptoms: nausea   Associated symptoms: no abdominal pain, no back pain, no cough, no diaphoresis, no dysphagia, no fever, no headache, no palpitations, no shortness of breath and no vomiting        Prior to Admission medications   Medication Sig Start Date End Date Taking? Authorizing Provider  amLODipine (NORVASC) 10 MG tablet Take 1 tablet by mouth daily. 09/11/20   [provider]  benzonatate  (TESSALON ) 100 MG capsule Take 1 capsule (100 mg total) by mouth every 8 (eight) hours. 12/08/20   Kip Lynwood Double, PA-C  fluticasone  (FLONASE ) 50 MCG/ACT nasal spray Place 2 sprays into both nostrils daily. 12/08/20   Kip Lynwood Double, PA-C  meloxicam (MOBIC) 15 MG tablet Take 15 mg by mouth daily.    [provider]  Multiple Vitamin (MULTIVITAMIN WITH MINERALS) TABS Take 1 tablet by mouth daily.    [provider]  olmesartan (BENICAR) 40 MG tablet Take 40 mg by mouth daily. 10/30/20   [provider]  predniSONE  (STERAPRED UNI-PAK 21 TAB) 10 MG (21) TBPK tablet Take by mouth daily. Take 6 tabs by mouth daily  for 2 days, then 5 tabs for 2 days, then 4 tabs for 2 days,  then 3 tabs for 2 days, 2 tabs for 2 days, then 1 tab by mouth daily for 2 days 09/13/21   Cuthriell, Jonathan D, PA-C  rOPINIRole (REQUIP) 0.25 MG tablet SMARTSIG:1-2 Tablet(s) By Mouth Every Evening 10/30/20   [provider]  rosuvastatin (CRESTOR) 10 MG tablet Take 10 mg by mouth daily. 09/11/20   [provider]  gabapentin (NEURONTIN) 100 MG capsule Take 100 mg by mouth 3 (three) times daily.    09/02/11  [provider]    Allergies: Aspirin    Review of Systems  Constitutional:  Negative for chills, diaphoresis and fever.  HENT:  Negative for ear pain, hearing loss, sore throat, tinnitus and trouble swallowing.   Eyes:  Negative for visual disturbance.  Respiratory:  Negative for cough and shortness of breath.   Cardiovascular:  Positive for chest pain. Negative for palpitations and leg swelling.  Gastrointestinal:  Positive for nausea. Negative for abdominal pain and vomiting.  Genitourinary:  Negative for dysuria and flank pain.  Musculoskeletal:  Negative for back pain and neck pain.  Skin:  Negative for rash.  Neurological:  Negative for syncope, speech difficulty and headaches.    Updated Vital Signs BP (!) 142/63   Pulse (!) 54   Temp 98.5 F (36.9 C)   Resp 13   Ht 1.778 m (5' 10)   Wt 96.2 kg   SpO2 98%   BMI 30.42 kg/m  Physical Exam Vitals and nursing note reviewed.  Constitutional:      Appearance: Normal appearance. She is well-developed.  HENT:     Head: Atraumatic.     Right Ear: Tympanic membrane and ear canal normal.     Left Ear: Tympanic membrane and ear canal normal.     Nose: Nose normal.     Mouth/Throat:     Mouth: Mucous membranes are moist.   Eyes:     General: No scleral icterus.    Extraocular Movements: Extraocular movements intact.     Conjunctiva/sclera: Conjunctivae normal.     Pupils: Pupils are equal, round, and reactive to light.   Neck:     Vascular: No carotid bruit.     Trachea: No tracheal  deviation.     Comments: Trachea midline, thyroid  not grossly enlarged or tender. No bruits.  Cardiovascular:     Rate and Rhythm: Normal rate and regular rhythm.     Pulses: Normal pulses.     Heart sounds: Normal heart sounds. No murmur heard.    No friction rub. No gallop.  Pulmonary:     Effort: Pulmonary effort is normal. No respiratory distress.     Breath sounds: Normal breath sounds.  Abdominal:     General: Bowel sounds are normal. There is no distension.     Palpations: Abdomen is soft. There is no mass.     Tenderness: There is no abdominal tenderness. There is no guarding.  Genitourinary:    Comments: No cva tenderness.   Musculoskeletal:        General: No swelling or tenderness.     Cervical back: Normal range of motion and neck supple. No rigidity or tenderness. No muscular tenderness.     Right lower leg: No edema.     Left lower leg: No edema.   Skin:    General: Skin is warm and dry.     Findings: No rash.   Neurological:     Mental Status: She is alert.     Comments: Alert, speech normal. No dysarthria or aphasia. No nystagmus. Motor/sens grossly intact bil. Stre 5/5. No pronator drift. Steady gait.   Psychiatric:        Mood and Affect: Mood normal.     (all labs ordered are listed, but only abnormal results are displayed) Results for orders placed or performed during the hospital encounter of 12/21/23  Comprehensive metabolic panel   Collection Time: 12/21/23  1:09 PM  Result Value Ref Range   Sodium 139 135 - 145 mmol/L   Potassium 4.2 3.5 - 5.1 mmol/L   Chloride 107 98 - 111 mmol/L   CO2 24 22 - 32 mmol/L   Glucose, Bld 175 (H) 70 - 99 mg/dL   BUN 15 8 - 23 mg/dL   Creatinine, Ser 8.84 (H) 0.44 - 1.00 mg/dL   Calcium 9.2 8.9 - 89.6 mg/dL   Total Protein 6.9 6.5 - 8.1 g/dL   Albumin 3.9 3.5 - 5.0 g/dL   AST 20 15 - 41 U/L   ALT 19 0 - 44 U/L   Alkaline Phosphatase 62 38 - 126 U/L   Total Bilirubin 0.7 0.0 - 1.2 mg/dL   GFR, Estimated 48 (L)  >60 mL/min   Anion gap 8 5 - 15  CBC with Differential   Collection Time: 12/21/23  1:09 PM  Result Value Ref Range   WBC 3.9 (L) 4.0 - 10.5 K/uL   RBC 4.37 3.87 - 5.11 MIL/uL  Hemoglobin 13.0 12.0 - 15.0 g/dL   HCT 60.6 63.9 - 53.9 %   MCV 89.9 80.0 - 100.0 fL   MCH 29.7 26.0 - 34.0 pg   MCHC 33.1 30.0 - 36.0 g/dL   RDW 86.3 88.4 - 84.4 %   Platelets 265 150 - 400 K/uL   nRBC 0.0 0.0 - 0.2 %   Neutrophils Relative % 56 %   Neutro Abs 2.3 1.7 - 7.7 K/uL   Lymphocytes Relative 31 %   Lymphs Abs 1.2 0.7 - 4.0 K/uL   Monocytes Relative 8 %   Monocytes Absolute 0.3 0.1 - 1.0 K/uL   Eosinophils Relative 3 %   Eosinophils Absolute 0.1 0.0 - 0.5 K/uL   Basophils Relative 1 %   Basophils Absolute 0.0 0.0 - 0.1 K/uL   Immature Granulocytes 1 %   Abs Immature Granulocytes 0.02 0.00 - 0.07 K/uL  Troponin I (High Sensitivity)   Collection Time: 12/21/23  1:09 PM  Result Value Ref Range   Troponin I (High Sensitivity) 7 <18 ng/L  Troponin I (High Sensitivity)   Collection Time: 12/21/23  3:10 PM  Result Value Ref Range   Troponin I (High Sensitivity) 10 <18 ng/L   DG Chest 2 View Result Date: 12/21/2023 CLINICAL DATA:  Chest pain. EXAM: CHEST - 2 VIEW COMPARISON:  11/14/2012. FINDINGS: Bilateral lung fields are clear. Bilateral costophrenic angles are clear. Normal cardio-mediastinal silhouette. No acute osseous abnormalities. Note is made of right distal clavicle resection, new since the prior study. The soft tissues are within normal limits. IMPRESSION: No active cardiopulmonary disease. Electronically Signed   By: Ree Molt M.D.   On: 12/21/2023 13:46     EKG: EKG Interpretation Date/Time:  Tuesday December 21 2023 12:54:04 EDT Ventricular Rate:  64 PR Interval:  150 QRS Duration:  82 QT Interval:  468 QTC Calculation: 482 R Axis:   18  Text Interpretation: Normal sinus rhythm Non-specific ST-t changes No previous tracing Confirmed by Bernard Drivers ([id number]) on 12/21/2023  12:57:48 PM  Radiology: [facility] Chest 2 View Result Date: 12/21/2023 CLINICAL DATA:  Chest pain. EXAM: CHEST - 2 VIEW COMPARISON:  11/14/2012. FINDINGS: Bilateral lung fields are clear. Bilateral costophrenic angles are clear. Normal cardio-mediastinal silhouette. No acute osseous abnormalities. Note is made of right distal clavicle resection, new since the prior study. The soft tissues are within normal limits. IMPRESSION: No active cardiopulmonary disease. Electronically Signed   By: Ree Molt M.D.   On: 12/21/2023 13:46     Procedures   Medications Ordered in the ED  acetaminophen  (TYLENOL ) tablet 1,000 mg (1,000 mg Oral Not Given 12/21/23 1533)  alum & mag hydroxide-simeth (MAALOX/MYLANTA) 200-200-20 MG/5ML suspension 30 mL (30 mLs Oral Given 12/21/23 1532)  famotidine (PEPCID) tablet 20 mg (20 mg Oral Given 12/21/23 1531)                                    Medical Decision Making Problems Addressed: Dizziness: acute illness or injury Hyperglycemia: acute illness or injury Nausea: acute illness or injury Precordial chest pain: acute illness or injury with systemic symptoms that poses a threat to life or bodily functions  Amount and/or Complexity of Data Reviewed External Data Reviewed: notes. Labs: ordered. Decision-making details documented in ED Course. Radiology: ordered and independent interpretation performed. Decision-making details documented in ED Course. ECG/medicine tests: ordered and independent interpretation performed. Decision-making details documented in ED Course.  Risk OTC drugs. Decision regarding hospitalization.   Iv ns. Continuous pulse ox and cardiac monitoring. Labs ordered/sent. Imaging ordered.   Differential diagnosis includes  . Dispo decision including potential need for admission considered - will get labs and imaging and reassess.   Reviewed nursing notes and prior charts for additional history. External reports reviewed. Additional history  from:  Cardiac monitor: sinus rhythm, rate  Labs reviewed/interpreted by me - wbc 3.9, not elevated. Hgb normal. Chem normal x glucose mildly high.  Trop normal.   Xrays reviewed/interpreted by me - no pna.   Acetaminophen  po, pepcid po, maalox po.   Delta trop pending.   Po fluids/food, ambulate in hall. Pt ambulates with steady gait, no ataxia. Feels improved. No chest pain. No faintness or presyncope.   Pt currently appears stable for e/d d/c.   Rec close pcp/cardiology f/u, referral sent.   Return precautions provided.       Final diagnoses:  Precordial chest pain  Nausea  Dizziness    ED Discharge Orders     None          Bernard Drivers, MD 12/21/23 1623

## 2023-12-23 DIAGNOSIS — Z6832 Body mass index (BMI) 32.0-32.9, adult: Secondary | ICD-10-CM | POA: Diagnosis not present

## 2023-12-23 DIAGNOSIS — R7303 Prediabetes: Secondary | ICD-10-CM | POA: Diagnosis not present

## 2023-12-23 DIAGNOSIS — E860 Dehydration: Secondary | ICD-10-CM | POA: Diagnosis not present

## 2023-12-23 DIAGNOSIS — E6609 Other obesity due to excess calories: Secondary | ICD-10-CM | POA: Diagnosis not present

## 2023-12-28 ENCOUNTER — Telehealth: Payer: Self-pay

## 2023-12-28 ENCOUNTER — Telehealth: Payer: Self-pay | Admitting: *Deleted

## 2023-12-28 DIAGNOSIS — M5126 Other intervertebral disc displacement, lumbar region: Secondary | ICD-10-CM

## 2023-12-28 NOTE — Progress Notes (Signed)
 Complex Care Management Note  Care Guide Note 12/28/2023 Name: ANAB VIVAR MRN: [id number] DOB: 07-15-1945  SARAI JANUARY is a 78 y.o. year old female who sees Marvine Rush, MD for primary care. I reached out to Slater FORBES Kleine by phone today to offer complex care management services.  Ms. Richardson was given information about Complex Care Management services today including:   The Complex Care Management services include support from the care team which includes your Nurse Care Manager, Clinical Social Worker, or Pharmacist.  The Complex Care Management team is here to help remove barriers to the health concerns and goals most important to you. Complex Care Management services are voluntary, and the patient may decline or stop services at any time by request to their care team member.   Complex Care Management Consent Status: Patient agreed to services and verbal consent obtained.   Follow up plan:  Telephone appointment with complex care management team member scheduled for:  12/31/23  Encounter Outcome:  Patient Scheduled  Harlene Satterfield  [facility]  [facility], [facility] Guide  Direct Dial: [phone]  Fax [phone]

## 2023-12-31 ENCOUNTER — Other Ambulatory Visit: Payer: Self-pay

## 2023-12-31 DIAGNOSIS — M461 Sacroiliitis, not elsewhere classified: Secondary | ICD-10-CM | POA: Insufficient documentation

## 2023-12-31 NOTE — Patient Outreach (Signed)
 LCSW called patient at scheduled time. LCSW explained the reason for the call and the referral. Patient explained that she was feeling better and did not want to move forward with enrollment into VBCI services. No other needs were identified. Patient was amenable to LCSW following up in a month. Nest appointment set for 01/28/2024 at 9:00 am.  Olam Ally, MSW, LCSW [place]  [facility], [facility] Licensed Clinical Social Worker Direct Dial: [phone]

## 2023-12-31 NOTE — Patient Instructions (Signed)
   Visit Information  Thank you for taking time to visit with me today. Please don't hesitate to contact me if I can be of assistance to you before our next scheduled appointment.  Our next appointment is by telephone on 01/28/2024 at 9:00 am Please call the care guide team at [phone] if you need to cancel or reschedule your appointment. Patient has declined services at this time.   Please call 911 if you are experiencing a Mental Health or Behavioral Health Crisis or need someone to talk to.  Patient verbalizes understanding of instructions and care plan provided today and agrees to view in MyChart. Active MyChart status and patient understanding of how to access instructions and care plan via MyChart confirmed with patient.     Olam Ally, MSW, LCSW [place]  [facility], [facility] Licensed Clinical Social Worker Direct Dial: [phone]

## 2024-01-07 ENCOUNTER — Ambulatory Visit: Admitting: Urology

## 2024-01-07 ENCOUNTER — Encounter: Payer: Self-pay | Admitting: Urology

## 2024-01-07 VITALS — BP 169/83 | HR 76

## 2024-01-07 DIAGNOSIS — R350 Frequency of micturition: Secondary | ICD-10-CM

## 2024-01-07 DIAGNOSIS — R300 Dysuria: Secondary | ICD-10-CM | POA: Diagnosis not present

## 2024-01-07 DIAGNOSIS — R809 Proteinuria, unspecified: Secondary | ICD-10-CM | POA: Insufficient documentation

## 2024-01-07 DIAGNOSIS — Z8639 Personal history of other endocrine, nutritional and metabolic disease: Secondary | ICD-10-CM

## 2024-01-07 DIAGNOSIS — R351 Nocturia: Secondary | ICD-10-CM | POA: Diagnosis not present

## 2024-01-07 LAB — URINALYSIS, ROUTINE W REFLEX MICROSCOPIC
Bilirubin, UA: NEGATIVE
Glucose, UA: NEGATIVE
Ketones, UA: NEGATIVE
Leukocytes,UA: NEGATIVE
Nitrite, UA: NEGATIVE
Protein,UA: NEGATIVE
RBC, UA: NEGATIVE
Specific Gravity, UA: 1.03 (ref 1.005–1.030)
Urobilinogen, Ur: 0.2 mg/dL (ref 0.2–1.0)
pH, UA: 5.5 (ref 5.0–7.5)

## 2024-01-07 NOTE — Progress Notes (Signed)
 Name: Samantha Oliver DOB: 1946-05-22 MRN: [id number]  History of Present Illness: Samantha Oliver is a 78 y.o. female who presents today as a new patient at [facility] [place]. All available relevant medical records have been reviewed.   Recent history:  > 10/25/2023:  - Seen by PCP (Dr. Marvine) for pain and pressure with urination x2 days. - UA with protein (30), small blood, and small leukocytes; no nitrites. Specific gravity >1.030. - Cipro prescribed empirically for suspected UTI.  - Urine culture came back negative.  > 11/17/2023:  - Seen by PCP (Dr. Marvine) for urinary frequency, urgency, and dysuria. - UA with protein (30) and trace blood; no leukocytes or nitrites. Specific gravity >1.030.  > 12/21/2023: Creatinine 1.15, GFR 48   Today: She reports that for the past several months she has had a funny feeling of pressure in her urethra towards the end of voiding; denies burning sensation or painful urination. She states these symptoms have been gradually improving recently.   She reports urinary frequency and nocturia x3. Denies urinary urgency, incontinence, gross hematuria, weak urinary stream, hesitancy, straining to void, or sensations of incomplete emptying.  She denies vaginal dryness, irritation, pain, bleeding, abnormal discharge; reports occasional vaginal itching. Denies vaginal bulge sensation or seeing a bulge.   She denies history of kidney stones.  She denies history of recent or recurrent UTI. She denies significant caffeine intake (about 1 caffeinated beverage per day on average).  She reports history of dehydration; states her ER visit on 12/21/2023 was for that partially. Denies follow up with her PCP yet.    Medications: Current Outpatient Medications  Medication Sig Dispense Refill   amLODipine (NORVASC) 10 MG tablet Take 1 tablet by mouth daily.     benzonatate  (TESSALON ) 100 MG capsule Take 1 capsule (100 mg total) by mouth every 8  (eight) hours. 21 capsule 0   fluticasone  (FLONASE ) 50 MCG/ACT nasal spray Place 2 sprays into both nostrils daily. 16 g 2   Multiple Vitamin (MULTIVITAMIN WITH MINERALS) TABS Take 1 tablet by mouth daily.     rosuvastatin (CRESTOR) 10 MG tablet Take 10 mg by mouth daily.     meloxicam (MOBIC) 15 MG tablet Take 15 mg by mouth daily. (Patient not taking: Reported on 01/07/2024)     olmesartan (BENICAR) 40 MG tablet Take 40 mg by mouth daily. (Patient not taking: Reported on 01/07/2024)     rOPINIRole (REQUIP) 0.25 MG tablet SMARTSIG:1-2 Tablet(s) By Mouth Every Evening (Patient not taking: Reported on 01/07/2024)     No current facility-administered medications for this visit.    Allergies: Allergies  Allergen Reactions   Aspirin Nausea And Vomiting    Past Medical History:  Diagnosis Date   Arthritis    Cataract immature    Hypercholesteremia    Hypertension    Past Surgical History:  Procedure Laterality Date   COLONOSCOPY  09/08/2011   Procedure: COLONOSCOPY;  Surgeon: Oneil DELENA Budge, MD;  [facility];  Service: Gastroenterology;  Laterality: N/A;   OOPHORECTOMY     Family History  Problem Relation Age of Onset   Heart attack Father    Social History   Socioeconomic History   Marital status: Divorced    Spouse name: Not on file   Number of children: Not on file   Years of education: Not on file   Highest education level: Not on file  Occupational History   Not on file  Tobacco Use   Smoking  status: Former    Passive exposure: Past   Smokeless tobacco: Never  Vaping Use   Vaping status: Never Used  Substance and Sexual Activity   Alcohol use: No   Drug use: No   Sexual activity: Not Currently  Other Topics Concern   Not on file  Social History Narrative   Not on file   Social Drivers of Health   Financial Resource Strain: Not on file  Food Insecurity: Not on file  Transportation Needs: Not on file  Physical Activity: Not on file  Stress: Not  on file  Social Connections: Unknown (11/04/2021)   Received [facility]   Social Network    Social Network: Not on file  Intimate Partner Violence: Unknown (09/26/2021)   Received from [facility]   HITS    Physically Hurt: Not on file    Insult or Talk Down To: Not on file    Threaten Physical Harm: Not on file    Scream or Curse: Not on file    SUBJECTIVE  Review of Systems Constitutional: Patient denies any unintentional weight loss or change in strength lntegumentary: Patient denies any rashes or pruritus Cardiovascular: Patient denies chest pain or syncope Respiratory: Patient denies shortness of breath Gastrointestinal: Patient denies nausea, vomiting, constipation, or diarrhea  Musculoskeletal: Patient denies muscle cramps or weakness Neurologic: Patient denies convulsions or seizures Allergic/Immunologic: Patient denies recent allergic reaction(s) Hematologic/Lymphatic: Patient denies bleeding tendencies Endocrine: Patient denies heat/cold intolerance  GU: As per HPI.  OBJECTIVE Vitals:   01/07/24 1104  BP: (!) 169/83  Pulse: 76   There is no height or weight on file to calculate BMI.  Physical Examination Constitutional: No obvious distress; patient is non-toxic appearing  Cardiovascular: No visible lower extremity edema.  Respiratory: The patient does not have audible wheezing/stridor; respirations do not appear labored  Gastrointestinal: Abdomen non-distended Musculoskeletal: Normal ROM of UEs  Skin: No obvious rashes/open sores  Neurologic: CN 2-12 grossly intact Psychiatric: Answered questions appropriately with normal affect  Hematologic/Lymphatic/Immunologic: No obvious bruises or sites of spontaneous bleeding  UA: negative   GU: Patient declined pelvic exam  ASSESSMENT Dysuria - Plan: Urinalysis, Routine w reflex microscopic  Nocturia - Plan: Urinalysis, Routine w reflex microscopic  Urinary frequency - Plan: Urinalysis, Routine w  reflex microscopic  History of dehydration  Unclear etiology for her abnormal urethral sensation; discussed vaginal atrophy and increased urine acidity secondary to dehydration as likely contributing factors. We agreed to proceed with observation for now with recheck in 6 weeks. She was advised to increase her water  intake and follow up with her PCP regarding her dehydration concerns. Patient verbalized understanding of and agreement with current plan. All questions were answered.  PLAN Advised the following: Maintain adequate daily fluid intake. Return in about 6 weeks (around 02/18/2024) for with UA.  Orders Placed This Encounter  Procedures   Urinalysis, Routine w reflex microscopic    It has been explained that the patient is to follow regularly with their PCP in addition to all other providers involved in their care and to follow instructions provided by these respective offices. Patient advised to contact urology clinic if any urologic-pertaining questions, concerns, new symptoms or problems arise in the interim period.  There are no Patient Instructions on file for this visit.  Electronically signed by:  Lauraine KYM Oz, MSN, FNP-C, CUNP 01/07/2024 11:26 AM

## 2024-01-12 ENCOUNTER — Other Ambulatory Visit: Payer: Self-pay | Admitting: Family Medicine

## 2024-01-12 DIAGNOSIS — Z1231 Encounter for screening mammogram for malignant neoplasm of breast: Secondary | ICD-10-CM

## 2024-01-28 ENCOUNTER — Other Ambulatory Visit: Payer: Self-pay

## 2024-01-28 NOTE — Patient Outreach (Signed)
 LCSW called patient for the agreed one month follow up from the EMMI referral. Patient reports that she is still doing fine and is still declining VBCI enrollment at this time. LCSW ensured that patient had LCSW's contact information if a need arises.  Olam Ally, MSW, LCSW [place]  [facility], [facility] Licensed Clinical Social Worker Direct Dial: [phone]

## 2024-02-04 ENCOUNTER — Ambulatory Visit
Admission: RE | Admit: 2024-02-04 | Discharge: 2024-02-04 | Disposition: A | Discharge status: Home / Self Care | Source: Ambulatory Visit | Attending: Family Medicine | Admitting: Family Medicine

## 2024-02-04 DIAGNOSIS — Z1231 Encounter for screening mammogram for malignant neoplasm of breast: Secondary | ICD-10-CM

## 2024-02-22 ENCOUNTER — Ambulatory Visit: Admitting: Urology

## 2024-03-15 ENCOUNTER — Ambulatory Visit: Admitting: Urology

## 2024-03-17 DIAGNOSIS — Z961 Presence of intraocular lens: Secondary | ICD-10-CM | POA: Diagnosis not present

## 2024-03-17 DIAGNOSIS — H5213 Myopia, bilateral: Secondary | ICD-10-CM | POA: Diagnosis not present

## 2024-03-28 ENCOUNTER — Ambulatory Visit: Admitting: Internal Medicine

## 2024-06-18 ENCOUNTER — Ambulatory Visit
Admission: EM | Admit: 2024-06-18 | Discharge: 2024-06-18 | Disposition: A | Discharge status: Home / Self Care | Attending: Family Medicine | Admitting: Family Medicine

## 2024-06-18 DIAGNOSIS — J069 Acute upper respiratory infection, unspecified: Secondary | ICD-10-CM | POA: Diagnosis not present

## 2024-06-18 MED ORDER — ALBUTEROL SULFATE HFA 108 (90 BASE) MCG/ACT IN AERS: 2.0000 | INHALATION_SPRAY | RESPIRATORY_TRACT | 0 refills | Status: AC | PRN

## 2024-06-18 MED ORDER — PROMETHAZINE-DM 6.25-15 MG/5ML PO SYRP: 5.0000 mL | ORAL_SOLUTION | Freq: Four times a day (QID) | ORAL | 0 refills | Status: AC | PRN

## 2024-06-18 MED ORDER — AZELASTINE HCL 0.1 % NA SOLN: 1.0000 | Freq: Two times a day (BID) | NASAL | 0 refills | Status: AC

## 2024-06-18 NOTE — ED Provider Notes (Signed)
 " [facility]    CSN: [id number] Arrival date & time: 06/18/24  1135      History   Chief Complaint Chief Complaint  Patient presents with   Cough    HPI Samantha Oliver is a 78 y.o. female.   Patient presenting today with 6-day history of sore throat, congestion, cough, occasional chest tightness and shortness of breath.  Denies fever, chills, chest pain, abdominal pain, vomiting, diarrhea.  So far try Mucinex with minimal relief.  No known history of chronic pulmonary disease.    Past Medical History:  Diagnosis Date   Arthritis    Cataract immature    Hypercholesteremia    Hypertension     Patient Active Problem List   Diagnosis Date Noted   Proteinuria 01/07/2024   History of dehydration 01/07/2024   Inflammation of sacroiliac joint 12/31/2023   Lumbar spondylosis 07/06/2019   Spondylolisthesis 07/06/2019   HIP PAIN, RIGHT 08/20/2010   DEGENERATIVE DISC DISEASE, LUMBOSACRAL SPINE W/RADICULOPATHY 08/20/2010    Past Surgical History:  Procedure Laterality Date   COLONOSCOPY  09/08/2011   Procedure: COLONOSCOPY;  Surgeon: Oneil DELENA Budge, MD;  [facility];  Service: Gastroenterology;  Laterality: N/A;   OOPHORECTOMY      OB History   No obstetric history on file.      Home Medications    Prior to Admission medications  Medication Sig Start Date End Date Taking? Authorizing Provider  albuterol  (VENTOLIN  HFA) 108 (90 Base) MCG/ACT inhaler Inhale 2 puffs into the lungs every 4 (four) hours as needed. 06/18/24  Yes Stuart Vernell Norris, PA-C  azelastine  (ASTELIN ) 0.1 % nasal spray Place 1 spray into both nostrils 2 (two) times daily. Use in each nostril as directed 06/18/24  Yes Stuart Vernell Norris, PA-C  promethazine -dextromethorphan (PROMETHAZINE -DM) 6.25-15 MG/5ML syrup Take 5 mLs by mouth 4 (four) times daily as needed. 06/18/24  Yes Stuart Vernell Norris, PA-C  amLODipine (NORVASC) 10 MG tablet Take 1 tablet by mouth daily.  09/11/20   [provider]  benzonatate  (TESSALON ) 100 MG capsule Take 1 capsule (100 mg total) by mouth every 8 (eight) hours. 12/08/20   Kip Lynwood Double, PA-C  fluticasone  (FLONASE ) 50 MCG/ACT nasal spray Place 2 sprays into both nostrils daily. 12/08/20   Kip Lynwood Double, PA-C  meloxicam (MOBIC) 15 MG tablet Take 15 mg by mouth daily. Patient not taking: No sig reported    [provider]  Multiple Vitamin (MULTIVITAMIN WITH MINERALS) TABS Take 1 tablet by mouth daily.    [provider]  olmesartan (BENICAR) 40 MG tablet Take 40 mg by mouth daily. Patient not taking: No sig reported 10/30/20   [provider]  rOPINIRole (REQUIP) 0.25 MG tablet SMARTSIG:1-2 Tablet(s) By Mouth Every Evening Patient not taking: No sig reported 10/30/20   [provider]  rosuvastatin (CRESTOR) 10 MG tablet Take 10 mg by mouth daily. 09/11/20   [provider]  gabapentin (NEURONTIN) 100 MG capsule Take 100 mg by mouth 3 (three) times daily.    09/02/11  [provider]    Family History Family History  Problem Relation Age of Onset   Heart attack Father     Social History Social History[1]   Allergies   Aspirin   Review of Systems Review of Systems PER HPI  Physical Exam Triage Vital Signs ED Triage Vitals  Encounter Vitals Group     BP 06/18/24 1338 (!) 172/99     Girls Systolic BP Percentile --  Girls Diastolic BP Percentile --      Boys Systolic BP Percentile --      Boys Diastolic BP Percentile --      Pulse Rate 06/18/24 1338 66     Resp 06/18/24 1338 20     Temp 06/18/24 1338 98.1 F (36.7 C)     Temp Source 06/18/24 1338 Oral     SpO2 06/18/24 1338 97 %     Weight --      Height --      Head Circumference --      Peak Flow --      Pain Score 06/18/24 1336 0     Pain Loc --      Pain Education --      Exclude from Growth Chart --    No data found.  Updated Vital Signs BP (!) 172/99 (BP Location:  Right Arm)   Pulse 66   Temp 98.1 F (36.7 C) (Oral)   Resp 20   SpO2 97%   Visual Acuity Right Eye Distance:   Left Eye Distance:   Bilateral Distance:    Right Eye Near:   Left Eye Near:    Bilateral Near:     Physical Exam Vitals and nursing note reviewed.  Constitutional:      Appearance: Normal appearance.  HENT:     Head: Atraumatic.     Right Ear: Tympanic membrane and external ear normal.     Left Ear: Tympanic membrane and external ear normal.     Nose: Rhinorrhea present.     Mouth/Throat:     Mouth: Mucous membranes are moist.     Pharynx: Posterior oropharyngeal erythema present. No oropharyngeal exudate.  Eyes:     Extraocular Movements: Extraocular movements intact.     Conjunctiva/sclera: Conjunctivae normal.  Cardiovascular:     Rate and Rhythm: Normal rate and regular rhythm.     Heart sounds: Normal heart sounds.  Pulmonary:     Effort: Pulmonary effort is normal.     Breath sounds: Normal breath sounds. No wheezing or rales.  Musculoskeletal:        General: Normal range of motion.     Cervical back: Normal range of motion and neck supple.  Skin:    General: Skin is warm and dry.  Neurological:     Mental Status: She is alert and oriented to person, place, and time.  Psychiatric:        Mood and Affect: Mood normal.        Thought Content: Thought content normal.      UC Treatments / Results  Labs (all labs ordered are listed, but only abnormal results are displayed) Labs Reviewed - No data to display  EKG   Radiology No results found.  Procedures Procedures (including critical care time)  Medications Ordered in UC Medications - No data to display  Initial Impression / Assessment and Plan / UC Course  I have reviewed the triage vital signs and the nursing notes.  Pertinent labs & imaging results that were available during my care of the patient were reviewed by me and considered in my medical decision making (see chart for  details).     Vital signs and exam very reassuring today, hypertensive in triage likely secondary to over-the-counter cold medication.  Suspect viral respiratory infection.  Will treat with Astelin , Phenergan  DM, albuterol  as needed, supportive over-the-counter medications and home care.  Return for worsening or unresolving symptoms.  Final Clinical Impressions(s) /  UC Diagnoses   Final diagnoses:  Viral URI with cough     Discharge Instructions      In addition to the prescribed medications, you may use Coricidin HBP, plain Mucinex, saline sinus rinses, humidifiers.  Drink plenty of fluids, get lots of rest.  Follow-up for significantly worsening symptoms    ED Prescriptions     Medication Sig Dispense Auth. Provider   azelastine  (ASTELIN ) 0.1 % nasal spray Place 1 spray into both nostrils 2 (two) times daily. Use in each nostril as directed 30 mL Stuart Vernell Norris, PA-C   promethazine -dextromethorphan (PROMETHAZINE -DM) 6.25-15 MG/5ML syrup Take 5 mLs by mouth 4 (four) times daily as needed. 100 mL Stuart Vernell Norris, PA-C   albuterol  (VENTOLIN  HFA) 108 (90 Base) MCG/ACT inhaler Inhale 2 puffs into the lungs every 4 (four) hours as needed. 18 g Stuart Vernell Norris, [place]      PDMP not reviewed this encounter.    [1]  Social History Tobacco Use   Smoking status: Former    Passive exposure: Past   Smokeless tobacco: Never  Vaping Use   Vaping status: Never Used  Substance Use Topics   Alcohol use: No   Drug use: No     Stuart Vernell Norris, PA-C 06/18/24 1401  "

## 2024-06-18 NOTE — ED Triage Notes (Signed)
 Pt reports she has  a sore throat, SHOB, cough x 6 days

## 2024-06-18 NOTE — Discharge Instructions (Signed)
 In addition to the prescribed medications, you may use Coricidin HBP, plain Mucinex, saline sinus rinses, humidifiers.  Drink plenty of fluids, get lots of rest.  Follow-up for significantly worsening symptoms

## 2024-07-12 ENCOUNTER — Ambulatory Visit: Admitting: Internal Medicine
